# Patient Record
Sex: Female | Born: 1955 | Marital: Married | Smoking: Never smoker
Health system: Southern US, Community
[De-identification: ages and names within clinical notes are randomized; demographics above are authoritative.]

## PROBLEM LIST (undated history)

## (undated) DIAGNOSIS — F419 Anxiety disorder, unspecified: Secondary | ICD-10-CM

## (undated) DIAGNOSIS — E039 Hypothyroidism, unspecified: Secondary | ICD-10-CM

---

## 2013-10-27 DIAGNOSIS — M159 Polyosteoarthritis, unspecified: Secondary | ICD-10-CM | POA: Insufficient documentation

## 2013-10-27 DIAGNOSIS — E049 Nontoxic goiter, unspecified: Secondary | ICD-10-CM | POA: Insufficient documentation

## 2013-10-27 DIAGNOSIS — L719 Rosacea, unspecified: Secondary | ICD-10-CM | POA: Insufficient documentation

## 2013-10-27 DIAGNOSIS — E785 Hyperlipidemia, unspecified: Secondary | ICD-10-CM | POA: Insufficient documentation

## 2013-10-27 DIAGNOSIS — F3289 Other specified depressive episodes: Secondary | ICD-10-CM | POA: Insufficient documentation

## 2013-10-27 DIAGNOSIS — F329 Major depressive disorder, single episode, unspecified: Secondary | ICD-10-CM | POA: Insufficient documentation

## 2014-04-14 ENCOUNTER — Ambulatory Visit: Payer: Self-pay | Admitting: Internal Medicine

## 2014-04-15 DIAGNOSIS — F419 Anxiety disorder, unspecified: Secondary | ICD-10-CM | POA: Insufficient documentation

## 2014-04-15 DIAGNOSIS — E039 Hypothyroidism, unspecified: Secondary | ICD-10-CM | POA: Insufficient documentation

## 2015-03-06 ENCOUNTER — Other Ambulatory Visit: Payer: Self-pay | Admitting: Family Medicine

## 2015-03-06 DIAGNOSIS — Z1231 Encounter for screening mammogram for malignant neoplasm of breast: Secondary | ICD-10-CM

## 2015-03-13 ENCOUNTER — Ambulatory Visit: Payer: Self-pay

## 2015-03-23 ENCOUNTER — Ambulatory Visit
Admission: RE | Admit: 2015-03-23 | Discharge: 2015-03-23 | Disposition: A | Discharge status: Home / Self Care | Payer: BLUE CROSS/BLUE SHIELD | Source: Ambulatory Visit | Attending: Family Medicine | Admitting: Family Medicine

## 2015-03-23 DIAGNOSIS — Z1231 Encounter for screening mammogram for malignant neoplasm of breast: Secondary | ICD-10-CM | POA: Diagnosis not present

## 2015-06-09 ENCOUNTER — Emergency Department: Payer: BLUE CROSS/BLUE SHIELD

## 2015-06-09 ENCOUNTER — Encounter: Payer: Self-pay | Admitting: Emergency Medicine

## 2015-06-09 ENCOUNTER — Emergency Department
Admission: EM | Admit: 2015-06-09 | Discharge: 2015-06-09 | Disposition: A | Discharge status: Home / Self Care | Payer: BLUE CROSS/BLUE SHIELD | Attending: Emergency Medicine | Admitting: Emergency Medicine

## 2015-06-09 DIAGNOSIS — S43084A Other dislocation of right shoulder joint, initial encounter: Secondary | ICD-10-CM | POA: Diagnosis not present

## 2015-06-09 DIAGNOSIS — Y998 Other external cause status: Secondary | ICD-10-CM | POA: Diagnosis not present

## 2015-06-09 DIAGNOSIS — Y9389 Activity, other specified: Secondary | ICD-10-CM | POA: Diagnosis not present

## 2015-06-09 DIAGNOSIS — S9031XA Contusion of right foot, initial encounter: Secondary | ICD-10-CM | POA: Insufficient documentation

## 2015-06-09 DIAGNOSIS — S43004A Unspecified dislocation of right shoulder joint, initial encounter: Secondary | ICD-10-CM

## 2015-06-09 DIAGNOSIS — Y9289 Other specified places as the place of occurrence of the external cause: Secondary | ICD-10-CM | POA: Insufficient documentation

## 2015-06-09 DIAGNOSIS — W108XXA Fall (on) (from) other stairs and steps, initial encounter: Secondary | ICD-10-CM | POA: Diagnosis not present

## 2015-06-09 DIAGNOSIS — S4991XA Unspecified injury of right shoulder and upper arm, initial encounter: Secondary | ICD-10-CM | POA: Diagnosis present

## 2015-06-09 HISTORY — DX: Anxiety disorder, unspecified: F41.9

## 2015-06-09 MED ORDER — KETAMINE HCL 10 MG/ML IJ SOLN
INTRAMUSCULAR | Status: AC | PRN
  Administered 2015-06-09: 70 mg via INTRAVENOUS

## 2015-06-09 MED ORDER — MIDAZOLAM HCL 5 MG/5ML IJ SOLN
INTRAMUSCULAR | Status: AC
  Filled 2015-06-09: qty 5

## 2015-06-09 MED ORDER — ETOMIDATE 2 MG/ML IV SOLN
INTRAVENOUS | Status: AC | PRN
  Administered 2015-06-09: 8 mg via INTRAVENOUS

## 2015-06-09 MED ORDER — MIDAZOLAM HCL 5 MG/5ML IJ SOLN
2.0000 mg | Freq: Once | INTRAMUSCULAR | Status: AC
  Filled 2015-06-09: qty 5

## 2015-06-09 MED ORDER — MIDAZOLAM HCL 2 MG/2ML IJ SOLN
INTRAMUSCULAR | Status: AC | PRN
  Administered 2015-06-09: 2 mg via INTRAVENOUS

## 2015-06-09 MED ORDER — KETOROLAC TROMETHAMINE 30 MG/ML IJ SOLN
30.0000 mg | Freq: Once | INTRAMUSCULAR | Status: AC
Start: 2015-06-09 — End: 2015-06-09
  Administered 2015-06-09: 30 mg via INTRAVENOUS
  Filled 2015-06-09: qty 1

## 2015-06-09 MED ORDER — HYDROCODONE-ACETAMINOPHEN 5-325 MG PO TABS: 1.0000 | ORAL_TABLET | Freq: Once | ORAL | Status: DC

## 2015-06-09 MED ORDER — ETOMIDATE 2 MG/ML IV SOLN
8.0000 mg | Freq: Once | INTRAVENOUS | Status: AC
  Filled 2015-06-09: qty 10

## 2015-06-09 MED ORDER — ETOMIDATE 2 MG/ML IV SOLN: 10.0000 mg | Freq: Once | INTRAVENOUS | Status: DC

## 2015-06-09 MED ORDER — KETAMINE HCL 50 MG/ML IJ SOLN
INTRAMUSCULAR | Status: AC
  Filled 2015-06-09: qty 10

## 2015-06-09 NOTE — Discharge Instructions (Signed)
How to Use a Shoulder Immobilizer A shoulder immobilizer is a device that you may have to wear after a shoulder injury or surgery. This device keeps your arm from moving. This prevents additional pain or injury. It also supports your arm next to your body as your shoulder heals. You may need to wear a shoulder immobilizer to treat a broken bone (fracture) in your shoulder. You may also need to wear one if you have an injury that moves your shoulder out of position (dislocation). There are different types of shoulder immobilizers. The one that you get depends on your injury. RISKS AND COMPLICATIONS Wearing a shoulder immobilizer in the wrong way can let your injured shoulder move around too much. This may delay healing and make your pain and swelling worse. HOW TO USE YOUR SHOULDER IMMOBILIZER  The part of the immobilizer that goes around your neck (sling) should support your upper arm, with your elbow bent and your lower arm and hand across your chest.  Make sure that your elbow:  Is snug against the back pocket of the sling.  Does not move away from your body.  The strap of the immobilizer should go over your shoulder and support your arm and hand. Your hand should be slightly higher than your elbow. It should not hang loosely over the edge of the sling.  If the long strap has a pad, place it where it is most comfortable on your neck.  Carefully follow your health care provider's instructions for wearing your shoulder immobilizer. Your health care provider may want you to:  Loosen your immobilizer to straighten your elbow and move your wrist and fingers. You may have to do this several times each day. Ask your health care provider when you should do this and how often.  Remove your immobilizer once every day to shower, but limit the movement in your injured arm. Before putting the immobilizer back on, use a towel to dry the area under your arm completely.  Remove your immobilizer to do  shoulder exercises at home as directed by your health care provider.  Wear your immobilizer while you sleep. You may sleep more comfortably if you have your upper body raised on pillows. SEEK MEDICAL CARE IF:  Your immobilizer is not supporting your arm properly.  Your immobilizer gets damaged.  You have worsening pain or swelling in your shoulder, arm, or hand.  Your shoulder, arm, or hand changes color or temperature.  You lose feeling in your shoulder, arm, or hand.   This information is not intended to replace advice given to you by your health care provider. Make sure you discuss any questions you have with your health care provider.   Document Released: 08/08/2004 Document Revised: 11/15/2014 Document Reviewed: 06/08/2014 Elsevier Interactive Patient Education 2016 [employer]  Shoulder Dislocation A shoulder dislocation happens when the upper arm bone (humerus) moves out of the shoulder joint. The shoulder joint is the part of the shoulder where the humerus, shoulder blade (scapula), and collarbone (clavicle) meet. CAUSES This condition is often caused by:  A fall.  A hit to the shoulder.  A forceful movement of the shoulder. RISK FACTORS This condition is more likely to develop in people who play sports. SYMPTOMS Symptoms of this condition include:  Deformity of the shoulder.  Intense pain.  Inability to move the shoulder.  Numbness, weakness, or tingling in your neck or down your arm.  Bruising or swelling around your shoulder. DIAGNOSIS This condition is diagnosed with  a physical exam. After the exam, tests may be done to check for related problems. Tests that may be done include:  X-ray. This may be done to check for broken bones.  MRI. This may be done to check for damage to the tissues around the shoulder.  Electromyogram. This may be done to check for nerve damage. TREATMENT This condition is treated with a procedure to place the humerus back in  the joint. This procedure is called a reduction. There are two types of reduction:  Closed reduction. In this procedure, the humerus is placed back in the joint without surgery. The health care provider uses his or her hands to guide the bone back into place.  Open reduction. In this procedure, the humerus is placed back in the joint with surgery. An open reduction may be recommended if:  You have a weak shoulder joint or weak ligaments.  You have had more than one shoulder dislocation.  The nerves or blood vessels around your shoulder have been damaged. After the humerus is placed back into the joint, your arm will be placed in a splint or sling to prevent it from moving. You will need to wear the splint or sling until your shoulder heals. When the splint or sling is removed, you may have physical therapy to help improve the range of motion in your shoulder joint. HOME CARE INSTRUCTIONS If You Have a Splint or Sling:  Wear it as told by your health care provider. Remove it only as told by your health care provider.  Loosen it if your fingers become numb and tingle, or if they turn cold and blue.  Keep it clean and dry. Bathing  Do not take baths, swim, or use a hot tub until your health care provider approves. Ask your health care provider if you can take showers. You may only be allowed to take sponge baths for bathing.  If your health care provider approves bathing and showering, cover your splint or sling with a watertight plastic bag to protect it from water. Do not let the splint or sling get wet. Managing Pain, Stiffness, and Swelling  If directed, apply ice to the injured area.  Put ice in a plastic bag.  Place a towel between your skin and the bag.  Leave the ice on for 20 minutes, 2-3 times per day.  Move your fingers often to avoid stiffness and to decrease swelling.  Raise (elevate) the injured area above the level of your heart while you are sitting or lying  down. Driving  Do not drive while wearing a splint or sling on a hand that you use for driving.  Do not drive or operate heavy machinery while taking pain medicine. Activity  Return to your normal activities as told by your health care provider. Ask your health care provider what activities are safe for you.  Perform range-of-motion exercises only as told by your health care provider.  Exercise your hand by squeezing a soft ball. This helps to decrease stiffness and swelling in your hand and wrist. General Instructions  Take over-the-counter and prescription medicines only as told by your health care provider.  Do not use any tobacco products, including cigarettes, chewing tobacco, or e-cigarettes. Tobacco can delay bone and tissue healing. If you need help quitting, ask your health care provider.  Keep all follow-up visits as told by your health care provider. This is important. SEEK MEDICAL CARE IF:  Your splint or sling gets damaged. [place]  IF:  Your pain gets worse rather than better.  You lose feeling in your arm or hand.  Your arm or hand becomes white and cold.   This information is not intended to replace advice given to you by your health care provider. Make sure you discuss any questions you have with your health care provider.   Document Released: 03/26/2001 Document Revised: 03/22/2015 Document Reviewed: 10/24/2014 Elsevier Interactive Patient Education [employer].

## 2015-06-09 NOTE — ED Notes (Signed)
Pt presents to ED via POV from personal home with c/o of right shoulder injury due to an acute fall episode this evening. Pt states she "tripped" over a step stool by accident and fell the hardwood floor on affected site. Pt denies LOC and blurry vision. Swelling noted to affected site. Pt alert and oriented x4, ambulatory to Pod D area from triage.

## 2015-06-09 NOTE — ED Notes (Signed)
Pt totally AAOx3 at this time.

## 2015-06-09 NOTE — Sedation Documentation (Signed)
Shoulder reduced at this time, orders for follow up xray being placed.

## 2015-06-09 NOTE — ED Provider Notes (Signed)
Time Seen: Approximately ----------------------------------------- 9:53 PM on 06/09/2015 -----------------------------------------   I have reviewed the triage notes  Chief Complaint: Shoulder Injury   History of Present Illness: Alexandria Thompson is a 59 y.o. female who states she had a non-syncopal fall. Patient apparently tripped over his steps tstool and landed primarily on her right arm and shoulder region. She denies any head trauma. She denies any neck thoracic or lumbar spine pain. She states some mild pain in her right foot.   Past Medical History  Diagnosis Date  . Anxiety     There are no active problems to display for this patient.   History reviewed. No pertinent past surgical history.  History reviewed. No pertinent past surgical history.  No current outpatient prescriptions on file.  Allergies:  Review of patient's allergies indicates no known allergies.  Family History: Family History  Problem Relation Age of Onset  . Breast cancer Sister     Social History: Social History  Substance Use Topics  . Smoking status: Never Smoker   . Smokeless tobacco: None  . Alcohol Use: No     Review of Systems:    Constitutional: No fever Eyes: No visual disturbances Cardiac: No chest pain Respiratory: No shortness of breath, wheezing, or stridor Abdomen: No abdominal pain, no vomiting, No diarrhea Extremities: She denies any other extremity pain outside the right shoulder Skin: No rashes, easy bruising Neurologic: No focal weakness, trouble with speech or swollowing Patient describes some pain in or outside surface of her right foot. She denies any knee, hip pain.  Physical Exam:  ED Triage Vitals  Enc Vitals Group     BP 06/09/15 2101 112/61 mmHg     Pulse Rate 06/09/15 2101 78     Resp 06/09/15 2101 18     Temp 06/09/15 2101 98.2 F (36.8 C)     Temp Source 06/09/15 2101 Oral     SpO2 06/09/15 2101 98 %     Weight 06/09/15 2101 135 lb (61.236  kg)     Height 06/09/15 2101 5\' 2"  (1.575 m)     Head Cir --      Peak Flow --      Pain Score 06/09/15 2102 9     Pain Loc --      Pain Edu? --      Excl. in [place]? --     General: Awake , Alert , and Oriented times 3; GCS 15 Head: Normal cephalic , atraumatic Eyes: Pupils equal , round, reactive to light Nose/Throat: No nasal drainage, patent upper airway without erythema or exudate.  Neck: Supple, Full range of motion, No anterior adenopathy or palpable thyroid masses Lungs: Clear to ascultation without wheezes , rhonchi, or rales Heart: Regular rate, regular rhythm without murmurs , gallops , or rubs Abdomen: Soft, non tender without rebound, guarding , or rigidity; bowel sounds positive and symmetric in all 4 quadrants. No organomegaly .        Extremities: 2 plus symmetric pulses. No edema, clubbing or cyanosis Neurologic: normal ambulation, Motor symmetric without deficits, sensory intact Skin: warm, dry, no rashes     Radiology:   Narrative:    CLINICAL DATA: Lateral foot pain after tripping over stool today.  EXAM: RIGHT FOOT - 2 VIEW  COMPARISON: None.  FINDINGS: Alignment at the Lisfranc joint is normal on these two views. I do not see a fracture of the fifth metatarsal or compelling evidence of a anterior process fracture of the calcaneus.  Plantar and Achilles calcaneal spurs are present. There is some minimal dorsal soft tissue swelling in the vicinity of the proximal metaphyses of the metatarsals.  IMPRESSION: 1. No fracture observed on this two view series. No obvious malalignment. 2. Plantar and Achilles calcaneal spurs. 3. Minimal dorsal soft tissue swelling along the proximal forefoot.   Electronically Signed By: Van Clines M.D. On: 06/09/2015 22:41          DG Shoulder Right (Final result) Result time: 06/09/15 22:39:30   Final result by Rad Results In Interface (06/09/15 22:39:30)   Narrative:   CLINICAL DATA:  Glenohumeral joint anterior inferior dislocation, status postreduction.  EXAM: RIGHT SHOULDER - 2+ VIEW  COMPARISON: 06/09/2015 at 9:08 p.m.  FINDINGS: Internally rotated and transscapular views of the right shoulder suggests a small Hill-Sachs impaction along the humeral head. No definite bony Bankart. The shoulder has been successfully reduced.  IMPRESSION: 1. Successful reduction. Small Hill-Sachs impaction noted along the humeral head.   Electronically Signed By: Van Clines M.D. On: 06/09/2015 22:39          DG HumerUS Right (Final result) Result time: 06/09/15 21:18:55   Final result by Rad Results In Interface (06/09/15 21:18:55)   Narrative:   CLINICAL DATA: Tripped and fell, landed on shoulder with pain  EXAM: RIGHT HUMERUS - 2+ VIEW  COMPARISON: None  FINDINGS: Femoral head is located in an abnormal inferior anterior and medial position. No fracture identified involving the humerus.  IMPRESSION: Anterior dislocation of the right humerus.   Electronically Signed By: Skipper Cliche M.D. On: 06/09/2015 21:18          DG Shoulder Right (Final result) Result time: 06/09/15 21:19:38   Final result by Rad Results In Interface (06/09/15 21:19:38)   Narrative:   CLINICAL DATA: Fell over stool landing on shoulder. Shoulder pain.  EXAM: RIGHT SHOULDER - 2+ VIEW  COMPARISON: None.  FINDINGS: Anterior inferior dislocation of the right humeral head with respect to the glenoid observed. No well-defined Hill-Sachs impaction or well-defined bony Bankart injury is identified. No other fracture in the vicinity observed. AC joint alignment normal.  IMPRESSION: 1. Anterior inferior right glenohumeral dislocation.   Electronically Signed By: Van Clines M.D. On: 06/09/2015 21:19              I personally reviewed the radiologic studies   Procedures: #1: Conscious sedation: After consent was obtained  and alternatives discussed and risk factors reviewed the patient agreed to conscious sedation for reduction of her right shoulder dislocation. Patient's airway was assessed and we did not see any complications with giving anesthetic-type medication. Patient received first he tolerated that 8 mg IV, Versed 3 mg IV. The patient did not appear to have adequate relaxation to attempt reduction. He then received ketamine 70 mg IV. She appeared to have adequate relaxation and tolerated her anesthetic well. Bedside one-on-one care with conscious sedation with drug spurs by myself was total of 31 minutes. #2: Right shoulder reduction. Patient has an anterior dislocation and was reduced with retraction and lateral rotation of her right upper extremity. Patient had what felt like adequate reduction and follow-up x-rays show Hill-Sachs deformity but no other abnormalities. He remained neurovascularly intact.    ED Course:  Patient tolerated his stay here well and had adequate reduction of her shoulder. X-rays of her right foot show no fracture or dislocation. Patient felt comfortable with outpatient management and was observed here until her conscious sedation medication wore off that point that she was ambulatory.  All questions concerns were addressed at the bedside with her husband present.    Assessment:  Traumatic right shoulder dislocation Conscious sedation Right foot contusion   Plan:  Outpatient management Patient was advised to return immediately if condition worsens. Patient was advised to follow up with her primary care physician or other specialized physicians involved and in their current assessment.             Daymon Larsen, MD 06/09/15 2258

## 2015-11-29 ENCOUNTER — Ambulatory Visit (INDEPENDENT_AMBULATORY_CARE_PROVIDER_SITE_OTHER): Payer: BLUE CROSS/BLUE SHIELD

## 2015-11-29 ENCOUNTER — Ambulatory Visit (INDEPENDENT_AMBULATORY_CARE_PROVIDER_SITE_OTHER): Payer: BLUE CROSS/BLUE SHIELD | Admitting: Podiatry

## 2015-11-29 ENCOUNTER — Encounter: Payer: Self-pay | Admitting: Podiatry

## 2015-11-29 VITALS — BP 118/66 | HR 76 | Resp 16

## 2015-11-29 DIAGNOSIS — M779 Enthesopathy, unspecified: Secondary | ICD-10-CM

## 2015-11-29 DIAGNOSIS — M722 Plantar fascial fibromatosis: Secondary | ICD-10-CM

## 2015-11-29 MED ORDER — METHYLPREDNISOLONE 4 MG PO TBPK: ORAL_TABLET | ORAL | Status: DC

## 2015-11-29 MED ORDER — MELOXICAM 15 MG PO TABS: 15.0000 mg | ORAL_TABLET | Freq: Every day | ORAL | Status: DC

## 2015-11-29 NOTE — Progress Notes (Signed)
   Subjective:    Patient ID: Alexandria Thompson, female    DOB: 08/27/1955, 60 y.o.   MRN: [id number]  HPI: She presents today with a month duration of pain to the dorsal medial aspect of the left foot with burning and tingling. She denies any trauma. She points to the dorsal medial aspect of the first metatarsal medial cuneiform joint. She states that just the other day the foot was red and swollen in this area that she can't put any weight on it.    Review of Systems  All other systems reviewed and are negative.      Objective:   Physical Exam: Vital signs are stable alert and oriented 3. Pulses are palpable. Neurologic sensorium is intact. Deep tendon reflexes are intact. Muscle strength is 5 over 5 dorsiflexion plantar flexors and inverters everters all intrinsic musculature is intact. Orthopedic evaluation demonstrates all joints distal to the ankle range of motion without crepitation. She does have tenderness on palpation of the tibialis anterior at its insertion site at the base of the first metatarsal medial cuneiform joint region. It is soft tissue edema in the area radiographs do confirm soft tissue edema in this area no major osteoarthritis developing.        Assessment & Plan:  Insertional tibialis anterior tendinitis left.  Plan: At this point I put a small amount of dexamethasone at the point of maximal tenderness 2 mg was the dose. Started on a Medrol Dosepak to be followed by meloxicam. We discussed icing the area as well we also placed her in a short cam walker and I will follow-up with her in 1 month if she is not significantly improved and MRI will be necessary at that point.

## 2016-01-01 ENCOUNTER — Ambulatory Visit (INDEPENDENT_AMBULATORY_CARE_PROVIDER_SITE_OTHER): Payer: BLUE CROSS/BLUE SHIELD | Admitting: Podiatry

## 2016-01-01 ENCOUNTER — Encounter: Payer: Self-pay | Admitting: Podiatry

## 2016-01-01 VITALS — BP 99/60 | HR 72 | Resp 12

## 2016-01-01 DIAGNOSIS — M779 Enthesopathy, unspecified: Secondary | ICD-10-CM

## 2016-01-01 NOTE — Progress Notes (Signed)
She presents today after 1 month for follow-up of her posterior and anterior tibialis tendinitis left foot. She states that she has been wearing the boot at all times and the foot feels much better and the swelling has gone down.  Objective: Vital signs are stable alert and oriented 3. Pulses are palpable. Neurologic sensorium is intact. Deep tendon reflexes are intact. No tenderness on palpation of the anterior tibialis tendon or the posterior tibialis tendon along the length of the tendon or the insertion sites. There is no swelling to the foot.  Assessment: Well-healing tendinitis.  Plan: I placed her in a Tri-Lock brace today and will follow-up with her in 1 month if necessary.

## 2016-03-07 DIAGNOSIS — E7800 Pure hypercholesterolemia, unspecified: Secondary | ICD-10-CM | POA: Insufficient documentation

## 2016-06-11 ENCOUNTER — Other Ambulatory Visit: Payer: Self-pay | Admitting: Obstetrics and Gynecology

## 2016-06-11 DIAGNOSIS — Z1231 Encounter for screening mammogram for malignant neoplasm of breast: Secondary | ICD-10-CM

## 2016-07-18 ENCOUNTER — Ambulatory Visit
Admission: RE | Admit: 2016-07-18 | Discharge: 2016-07-18 | Disposition: A | Discharge status: Home / Self Care | Payer: BLUE CROSS/BLUE SHIELD | Source: Ambulatory Visit | Attending: Obstetrics and Gynecology | Admitting: Obstetrics and Gynecology

## 2016-07-18 DIAGNOSIS — Z1231 Encounter for screening mammogram for malignant neoplasm of breast: Secondary | ICD-10-CM | POA: Insufficient documentation

## 2016-12-30 ENCOUNTER — Ambulatory Visit: Payer: BLUE CROSS/BLUE SHIELD

## 2016-12-31 ENCOUNTER — Ambulatory Visit: Payer: BLUE CROSS/BLUE SHIELD

## 2017-01-06 ENCOUNTER — Ambulatory Visit: Payer: BLUE CROSS/BLUE SHIELD

## 2017-02-24 ENCOUNTER — Ambulatory Visit: Payer: BLUE CROSS/BLUE SHIELD

## 2017-05-21 DIAGNOSIS — H6122 Impacted cerumen, left ear: Secondary | ICD-10-CM | POA: Insufficient documentation

## 2017-05-21 DIAGNOSIS — H9193 Unspecified hearing loss, bilateral: Secondary | ICD-10-CM | POA: Insufficient documentation

## 2017-06-18 DIAGNOSIS — H903 Sensorineural hearing loss, bilateral: Secondary | ICD-10-CM | POA: Insufficient documentation

## 2017-08-29 ENCOUNTER — Other Ambulatory Visit: Payer: Self-pay | Admitting: Family Medicine

## 2017-08-29 DIAGNOSIS — Z1231 Encounter for screening mammogram for malignant neoplasm of breast: Secondary | ICD-10-CM

## 2017-09-16 ENCOUNTER — Ambulatory Visit
Admission: RE | Admit: 2017-09-16 | Discharge: 2017-09-16 | Disposition: A | Discharge status: Home / Self Care | Payer: Managed Care, Other (non HMO) | Source: Ambulatory Visit | Attending: Family Medicine | Admitting: Family Medicine

## 2017-09-16 DIAGNOSIS — R928 Other abnormal and inconclusive findings on diagnostic imaging of breast: Secondary | ICD-10-CM | POA: Insufficient documentation

## 2017-09-16 DIAGNOSIS — Z1231 Encounter for screening mammogram for malignant neoplasm of breast: Secondary | ICD-10-CM | POA: Diagnosis not present

## 2017-09-18 ENCOUNTER — Other Ambulatory Visit: Payer: Self-pay | Admitting: Family Medicine

## 2017-09-18 DIAGNOSIS — R928 Other abnormal and inconclusive findings on diagnostic imaging of breast: Secondary | ICD-10-CM

## 2017-09-18 DIAGNOSIS — N6489 Other specified disorders of breast: Secondary | ICD-10-CM

## 2017-09-29 ENCOUNTER — Other Ambulatory Visit: Payer: Self-pay | Admitting: Family Medicine

## 2017-09-29 ENCOUNTER — Ambulatory Visit
Admission: RE | Admit: 2017-09-29 | Discharge: 2017-09-29 | Disposition: A | Discharge status: Home / Self Care | Payer: Managed Care, Other (non HMO) | Source: Ambulatory Visit | Attending: Family Medicine | Admitting: Family Medicine

## 2017-09-29 DIAGNOSIS — R928 Other abnormal and inconclusive findings on diagnostic imaging of breast: Secondary | ICD-10-CM

## 2017-09-29 DIAGNOSIS — N6002 Solitary cyst of left breast: Secondary | ICD-10-CM | POA: Insufficient documentation

## 2017-09-29 DIAGNOSIS — N6489 Other specified disorders of breast: Secondary | ICD-10-CM

## 2018-04-18 ENCOUNTER — Emergency Department
Admission: EM | Admit: 2018-04-18 | Discharge: 2018-04-18 | Disposition: A | Discharge status: Home / Self Care | Payer: Managed Care, Other (non HMO) | Attending: Emergency Medicine | Admitting: Emergency Medicine

## 2018-04-18 ENCOUNTER — Emergency Department: Payer: Managed Care, Other (non HMO)

## 2018-04-18 ENCOUNTER — Other Ambulatory Visit: Payer: Self-pay

## 2018-04-18 ENCOUNTER — Encounter: Payer: Self-pay | Admitting: Emergency Medicine

## 2018-04-18 DIAGNOSIS — E039 Hypothyroidism, unspecified: Secondary | ICD-10-CM | POA: Diagnosis not present

## 2018-04-18 DIAGNOSIS — Y9301 Activity, walking, marching and hiking: Secondary | ICD-10-CM | POA: Insufficient documentation

## 2018-04-18 DIAGNOSIS — R202 Paresthesia of skin: Secondary | ICD-10-CM | POA: Insufficient documentation

## 2018-04-18 DIAGNOSIS — W010XXA Fall on same level from slipping, tripping and stumbling without subsequent striking against object, initial encounter: Secondary | ICD-10-CM | POA: Diagnosis not present

## 2018-04-18 DIAGNOSIS — Y998 Other external cause status: Secondary | ICD-10-CM | POA: Insufficient documentation

## 2018-04-18 DIAGNOSIS — R200 Anesthesia of skin: Secondary | ICD-10-CM | POA: Insufficient documentation

## 2018-04-18 DIAGNOSIS — W19XXXA Unspecified fall, initial encounter: Secondary | ICD-10-CM

## 2018-04-18 DIAGNOSIS — Z79899 Other long term (current) drug therapy: Secondary | ICD-10-CM | POA: Insufficient documentation

## 2018-04-18 DIAGNOSIS — S40011A Contusion of right shoulder, initial encounter: Secondary | ICD-10-CM

## 2018-04-18 DIAGNOSIS — S20211A Contusion of right front wall of thorax, initial encounter: Secondary | ICD-10-CM

## 2018-04-18 DIAGNOSIS — Y92512 Supermarket, store or market as the place of occurrence of the external cause: Secondary | ICD-10-CM | POA: Diagnosis not present

## 2018-04-18 DIAGNOSIS — S4991XA Unspecified injury of right shoulder and upper arm, initial encounter: Secondary | ICD-10-CM | POA: Diagnosis present

## 2018-04-18 MED ORDER — MELOXICAM 15 MG PO TABS: 15.0000 mg | ORAL_TABLET | Freq: Every day | ORAL | 0 refills | Status: DC

## 2018-04-18 MED ORDER — HYDROCODONE-ACETAMINOPHEN 5-325 MG PO TABS: 1.0000 | ORAL_TABLET | ORAL | 0 refills | Status: DC | PRN

## 2018-04-18 NOTE — ED Provider Notes (Signed)
[facility] Emergency Department Provider Note  ____________________________________________  Time seen: Approximately 5:02 PM  I have reviewed the triage vital signs and the nursing notes.   HISTORY  Chief Complaint Arm Pain and Rib Injury    HPI Alexandria Thompson is a 62 y.o. female who presents the emergency department complaining of right rib, right shoulder, right upper arm pain.  Patient reports that she was at the grocery store, slipped on some wet floor, landing on her right side.  Patient reports that she struck a concrete piling on the way down.  She did not hit her head or lose consciousness.  Patient denies any shortness of breath or cough after the accident.  Main complaint is right rib pain, right shoulder/upper arm pain.  Patient does have some intermittent numbness and tingling down the right upper extremity.  She has limited range of motion to her shoulder at this time.  She is able to flex and extend as well as supinate and pronate the elbow appropriately.  Full range of motion to the wrist and all digits right hand.  Patient denies any headache, chest pain, shortness of breath, abdominal pain, nausea vomiting.  No medications for this complaint prior to arrival.    Past Medical History:  Diagnosis Date  . Anxiety     Patient Active Problem List   Diagnosis Date Noted  . Acquired hypothyroidism 04/15/2014  . Anxiety 04/15/2014    History reviewed. No pertinent surgical history.  Prior to Admission medications   Medication Sig Start Date End Date Taking? Authorizing Provider  HYDROcodone-acetaminophen (NORCO/VICODIN) 5-325 MG tablet Take 1 tablet by mouth every 4 (four) hours as needed for moderate pain. 04/18/18   Lesly Joslyn, Charline Bills, PA-C  levothyroxine (SYNTHROID, LEVOTHROID) 88 MCG tablet Take by mouth. 03/06/15 03/05/16  [provider]  meloxicam (MOBIC) 15 MG tablet Take 1 tablet (15 mg total) by mouth daily. 04/18/18    Carey Lafon, Charline Bills, PA-C  methylPREDNISolone (MEDROL) 4 MG TBPK tablet Tapering 6 day dose pack 11/29/15   Hyatt, Max T, DPM  Multiple Vitamin (MULTI-VITAMINS) TABS Take by mouth.    [provider]  sertraline (ZOLOFT) 100 MG tablet Take by mouth. 03/06/15   [provider]  simvastatin (ZOCOR) 10 MG tablet Take by mouth. 03/09/15 03/08/16  [provider]    Allergies Codeine  Family History  Problem Relation Age of Onset  . Breast cancer Sister   . Breast cancer Paternal Grandmother     Social History Social History   Tobacco Use  . Smoking status: Never Smoker  Substance Use Topics  . Alcohol use: No  . Drug use: No     Review of Systems  Constitutional: No fever/chills Eyes: No visual changes.  Cardiovascular: no chest pain. Respiratory: no cough. No SOB. Gastrointestinal: No abdominal pain.  No nausea, no vomiting.   Musculoskeletal: Positive for right rib, right shoulder, right upper extremity pain. Skin: Negative for rash, abrasions, lacerations, ecchymosis. Neurological: Negative for headaches, focal weakness or numbness. 10-point ROS otherwise negative.  ____________________________________________   PHYSICAL EXAM:  VITAL SIGNS: ED Triage Vitals  Enc Vitals Group     BP 04/18/18 1638 (!) 102/48     Pulse Rate 04/18/18 1638 66     Resp 04/18/18 1638 20     Temp 04/18/18 1638 97.6 F (36.4 C)     Temp Source 04/18/18 1638 Oral     SpO2 04/18/18 1638 97 %     Weight 04/18/18  1640 140 lb (63.5 kg)     Height 04/18/18 1640 5\' 3"  (1.6 m)     Head Circumference --      Peak Flow --      Pain Score 04/18/18 1639 8     Pain Loc --      Pain Edu? --      Excl. in [place]? --      Constitutional: Alert and oriented. Well appearing and in no acute distress. Eyes: Conjunctivae are normal. PERRL. EOMI. Head: Atraumatic.  No visible signs of trauma with ecchymosis, lacerations or abrasions.  Nontender to palpation of the osseous  structures of the face and skull.  No battle signs, raccoon eyes, serosanguineous fluid drainage from the ears or nares ENT:      Ears:       Nose: No congestion/rhinnorhea.      Mouth/Throat: Mucous membranes are moist.  Neck: No stridor.  Visualization of the cervical spine reveals no visible signs of trauma with ecchymosis, lacerations or abrasions.  No visible abnormality or deformity.  No midline cervical spine tenderness to palpation.  Mild tenderness to palpation right paraspinal muscle region.  Radial pulse intact bilateral upper extremities.  Incision intact and equal bilateral upper extremities.  Cardiovascular: Normal rate, regular rhythm. Normal S1 and S2.  Good peripheral circulation. Respiratory: Normal respiratory effort without tachypnea or retractions. Lungs CTAB. Good air entry to the bases with no decreased or absent breath sounds. Gastrointestinal: Visualization of the abdominal wall reveals no lacerations, abrasions, ecchymosis.  Bowel sounds 4 quadrants. Soft and nontender to palpation. No guarding or rigidity. No palpable masses. No distention. No CVA tenderness. Musculoskeletal: Limited range of motion to the right shoulder, otherwise full range of motion to extremities.. No gross deformities appreciated.  Visualization of the right shoulder reveals no visible signs of trauma.  Limited range of motion due to pain.  Patient is diffusely tender to palpation along the anterior and lateral aspect of the shoulder.  No specific point tenderness or palpable abnormality.  No tenderness palpation over the posterior shoulder/scapula.  Visualization of the right upper extremity reveals no deformity, gross edema, ecchymosis.  Patient is tender mid humerus with no palpable abnormality.  No tenderness to palpation.  Full range of motion to the right elbow, right wrist.  Full range of motion all 5 digits right hand.  Visualization of the ribs reveals no deformity, no paradoxical chest wall  movement.  Patient is diffusely tender to palpation in ribs 6 through 10 right rib cage.  No palpable abnormality.  No subcutaneous emphysema.  Good underlying breath sounds bilaterally. Neurologic:  Normal speech and language. No gross focal neurologic deficits are appreciated.  Cranial nerves II through XII grossly intact. Skin:  Skin is warm, dry and intact. No rash noted. Psychiatric: Mood and affect are normal. Speech and behavior are normal. Patient exhibits appropriate insight and judgement.   ____________________________________________   LABS (all labs ordered are listed, but only abnormal results are displayed)  Labs Reviewed - No data to display ____________________________________________  EKG   ____________________________________________  RADIOLOGY I personally viewed and evaluated these images as part of my medical decision making, as well as reviewing the written report by the radiologist.   Dg Ribs Unilateral W/chest Right  Result Date: 04/18/2018 CLINICAL DATA:  [place] onto RIGHT side today at a grocery store, diffuse RIGHT side chest wall pain, RIGHT upper arm pain in RIGHT shoulder pain EXAM: RIGHT RIBS AND CHEST - 3+ VIEW COMPARISON:  None FINDINGS: Normal heart size, mediastinal contours, and pulmonary vascularity. Lungs clear. No infiltrate, pleural effusion or pneumothorax. Osseous mineralization normal. Visualized RIGHT shoulder unremarkable. No definite rib fractures identified. IMPRESSION: No acute abnormalities. Electronically Signed   By: Lavonia Dana M.D.   On: 04/18/2018 17:38   Dg Cervical Spine 2-3 Views  Result Date: 04/18/2018 CLINICAL DATA:  [place] onto RIGHT side today at a grocery store, diffuse RIGHT side chest wall pain, RIGHT upper arm pain and RIGHT shoulder pain EXAM: CERVICAL SPINE - 2-3 VIEW COMPARISON:  None FINDINGS: Prevertebral soft tissues normal thickness. Bones demineralized. Reversal of cervical lordosis question muscle spasm. Disc space  narrowing with endplate spur formation at C4-C5, C5-C6 and C6-C7. Minimal anterolisthesis at C3-C4. Scattered mild facet degenerative changes. Vertebral body heights maintained without fracture or additional subluxation. No bone destruction. Odontoid slightly approximates the LEFT lateral mass of C1 though head is rotated. IMPRESSION: Degenerative disc and facet disease changes of the cervical spine as above with reversal of cervical lordosis question muscle spasm. No definite fractures identified. Electronically Signed   By: Lavonia Dana M.D.   On: 04/18/2018 17:40   Dg Shoulder Right  Result Date: 04/18/2018 CLINICAL DATA:  Right shoulder pain after fall today EXAM: RIGHT SHOULDER - 2+ VIEW COMPARISON:  None. FINDINGS: There is no evidence of fracture or dislocation. There is no evidence of arthropathy or other focal bone abnormality. Soft tissues are unremarkable. IMPRESSION: No right shoulder fracture or malalignment. Electronically Signed   By: Ilona Sorrel M.D.   On: 04/18/2018 17:35   Dg Humerus Right  Result Date: 04/18/2018 CLINICAL DATA:  [place] onto RIGHT side today at a grocery store, diffuse RIGHT side chest wall pain, RIGHT upper arm pain and RIGHT shoulder pain EXAM: RIGHT HUMERUS - 2+ VIEW COMPARISON:  RIGHT shoulder radiographs 06/09/2015 FINDINGS: AC joint alignment normal. Bones demineralized. Shoulder and elbow joint alignments grossly normal. No fracture, dislocation, or bone destruction. IMPRESSION: No acute osseous abnormalities. Electronically Signed   By: Lavonia Dana M.D.   On: 04/18/2018 17:41    ____________________________________________    PROCEDURES  Procedure(s) performed:    Procedures    Medications - No data to display   ____________________________________________   INITIAL IMPRESSION / ASSESSMENT AND PLAN / ED COURSE  Pertinent labs & imaging results that were available during my care of the patient were reviewed by me and considered in my medical  decision making (see chart for details).  Review of the [place] CSRS was performed in accordance of the [place] prior to dispensing any controlled drugs.      Patient's diagnosis is consistent with fall resulting in contusion of the shoulder, ribs.  Patient presented to the emergency department complaining of right-sided neck, right rib, right shoulder, right upper arm pain.  Exam was reassuring with lung sounds bilaterally, no significant visible signs of trauma.  X-ray reveals no acute osseous abnormality.  Chest x-ray reveals no pneumothorax.  Differential included fracture, dislocation of the shoulder, rotator cuff tear, pneumothorax, pulmonary contusion.  Patient will be placed on anti-inflammatory given limited prescription of pain medication to be taken as needed.  Follow-up with primary care as needed. Patient is given ED precautions to return to the ED for any worsening or new symptoms.     ____________________________________________  FINAL CLINICAL IMPRESSION(S) / ED DIAGNOSES  Final diagnoses:  Fall, initial encounter  Contusion of right shoulder, initial encounter  Contusion of rib on right side, initial encounter  NEW MEDICATIONS STARTED DURING THIS VISIT:  ED Discharge Orders         Ordered    meloxicam (MOBIC) 15 MG tablet  Daily     04/18/18 1846    HYDROcodone-acetaminophen (NORCO/VICODIN) 5-325 MG tablet  Every 4 hours PRN     04/18/18 1846              This chart was dictated using voice recognition software/Dragon. Despite best efforts to proofread, errors can occur which can change the meaning. Any change was purely unintentional.    Darletta Moll, PA-C 04/18/18 1847    Nena Polio, MD 04/19/18 [id number]

## 2018-04-18 NOTE — ED Triage Notes (Signed)
Slipped and fell just prior to arrival, pain R upper arm and ribcage.

## 2018-04-18 NOTE — ED Notes (Signed)
Fell  At grocery store   Pain  In r  Rib cage   Decreased rom    Pain r shoulder down   Did not hit head  Is awake and alert

## 2019-01-05 ENCOUNTER — Other Ambulatory Visit: Payer: Self-pay | Admitting: Internal Medicine

## 2019-01-05 DIAGNOSIS — Z20822 Contact with and (suspected) exposure to covid-19: Secondary | ICD-10-CM

## 2019-01-09 LAB — NOVEL CORONAVIRUS, NAA: SARS-CoV-2, NAA: NOT DETECTED

## 2019-03-17 IMAGING — MG MM DIGITAL SCREENING BILAT W/ TOMO W/ CAD
9 of 15 series · 9 of 31 positions shown · non-contrast
Comparison: Previous exam(s).

CLINICAL DATA: Screening.

EXAM:
DIGITAL SCREENING BILATERAL MAMMOGRAM WITH TOMO AND CAD

[L MLO (1 of 2)]
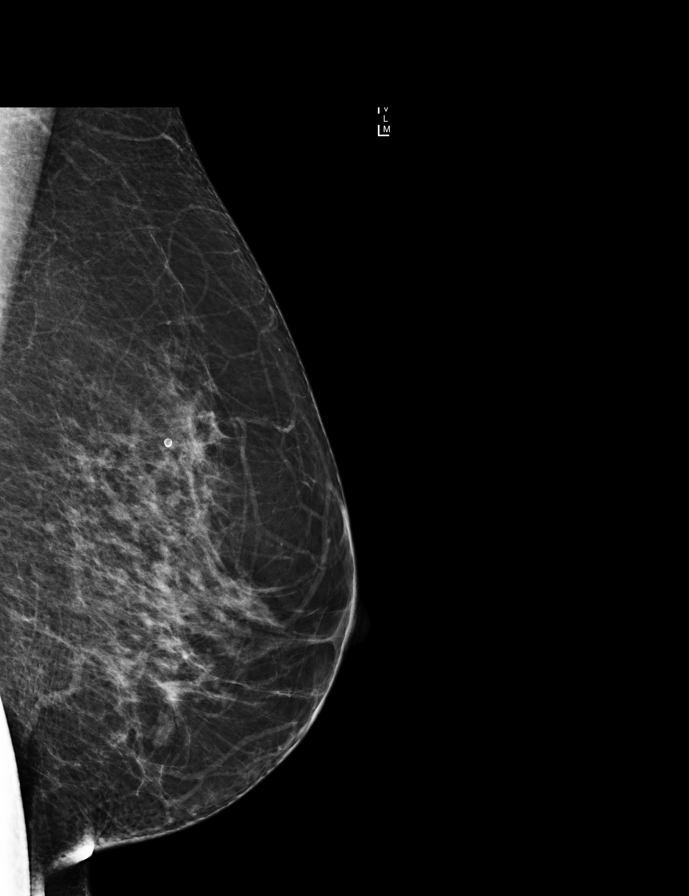

[R MLO (1 of 2)]
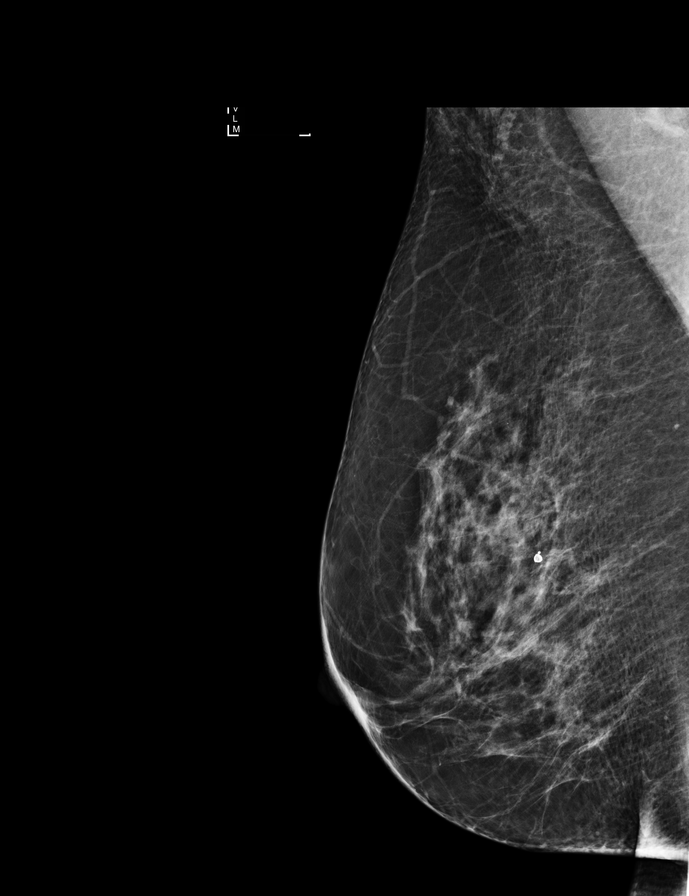

[R CC]
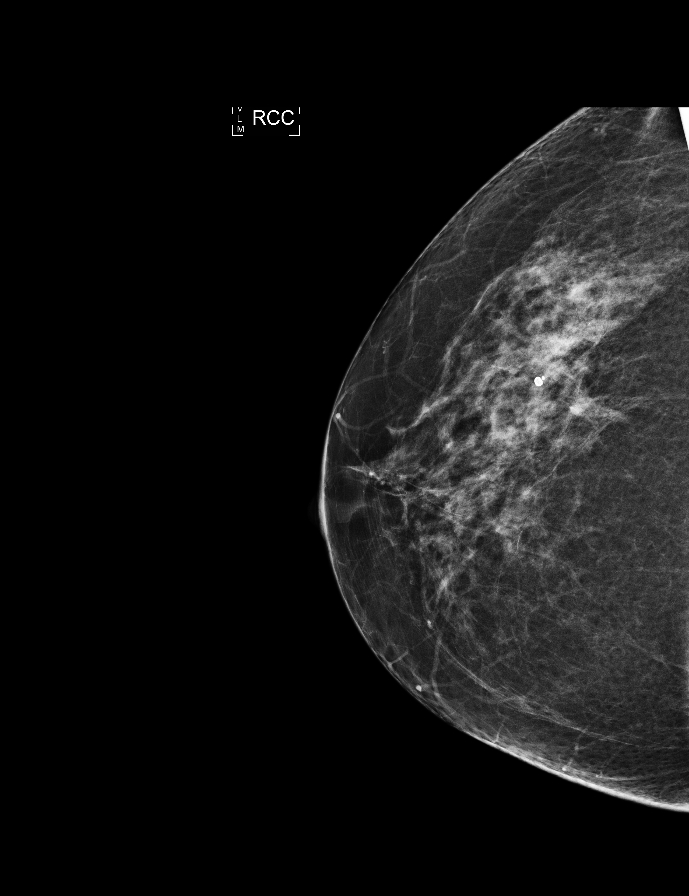

[R MLO (2 of 2)]
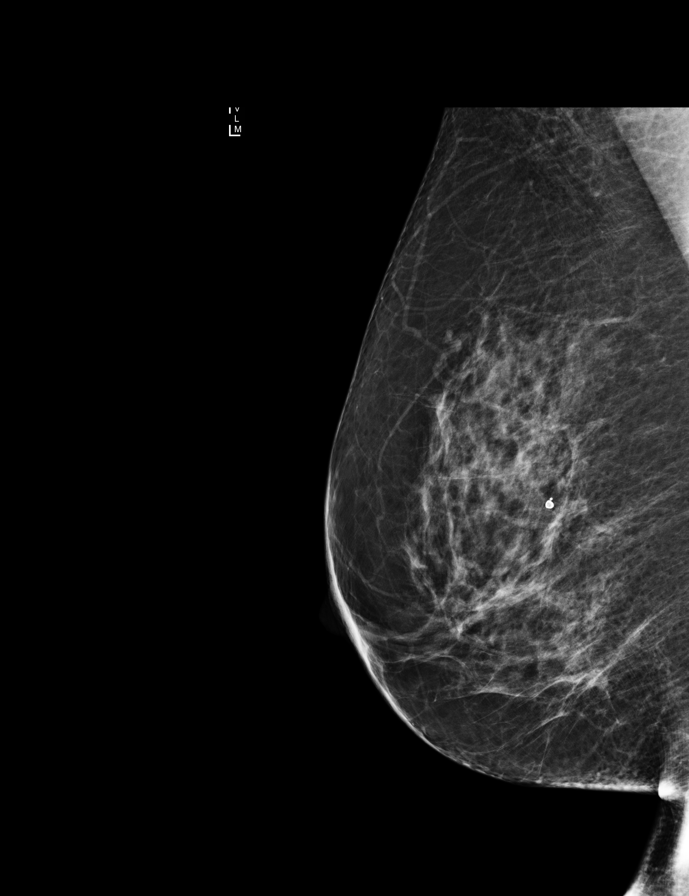

[L CC synth-2D]
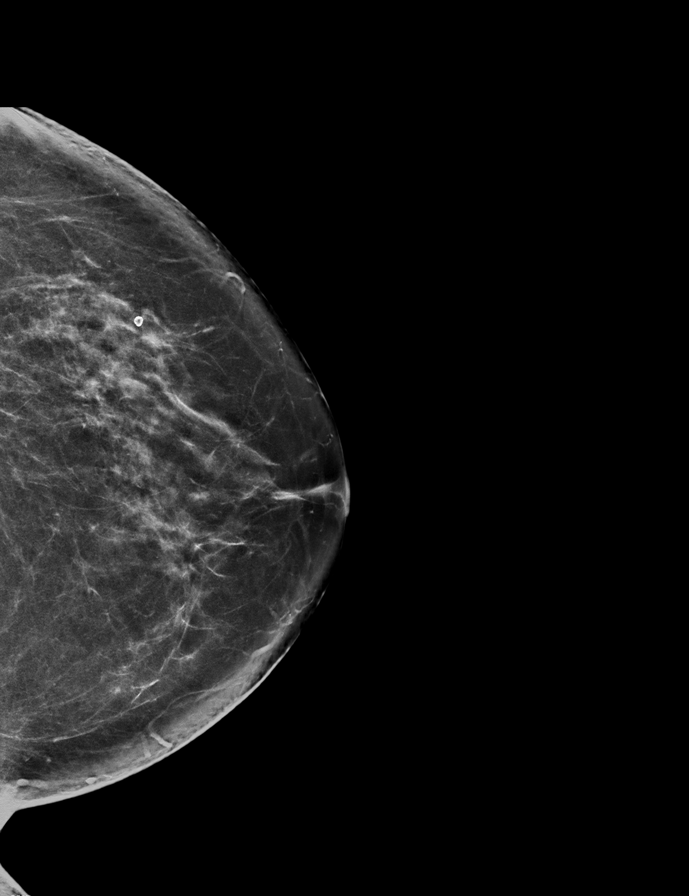

[L MLO (2 of 2)]
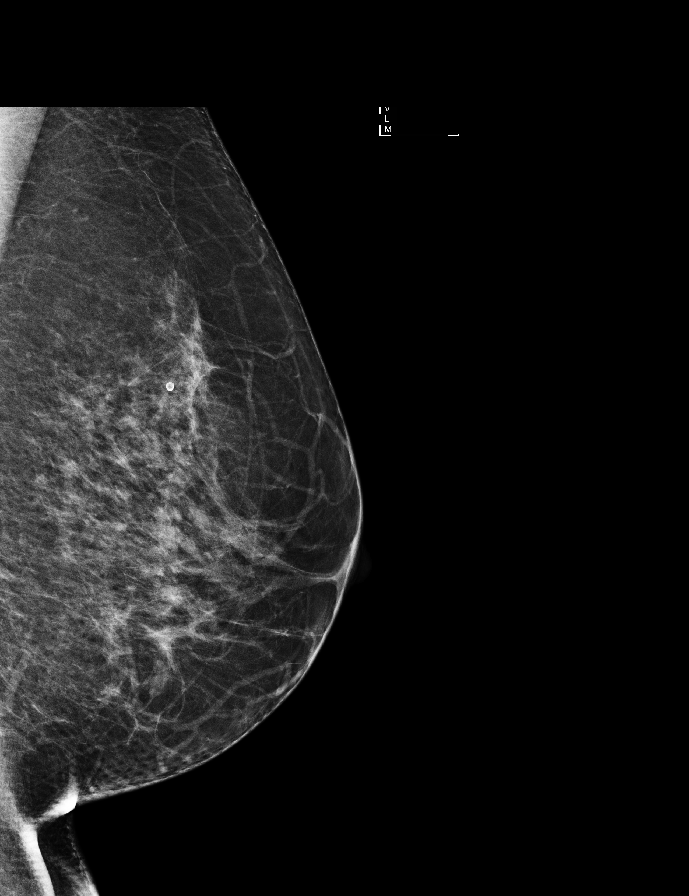

[R CC synth-2D]
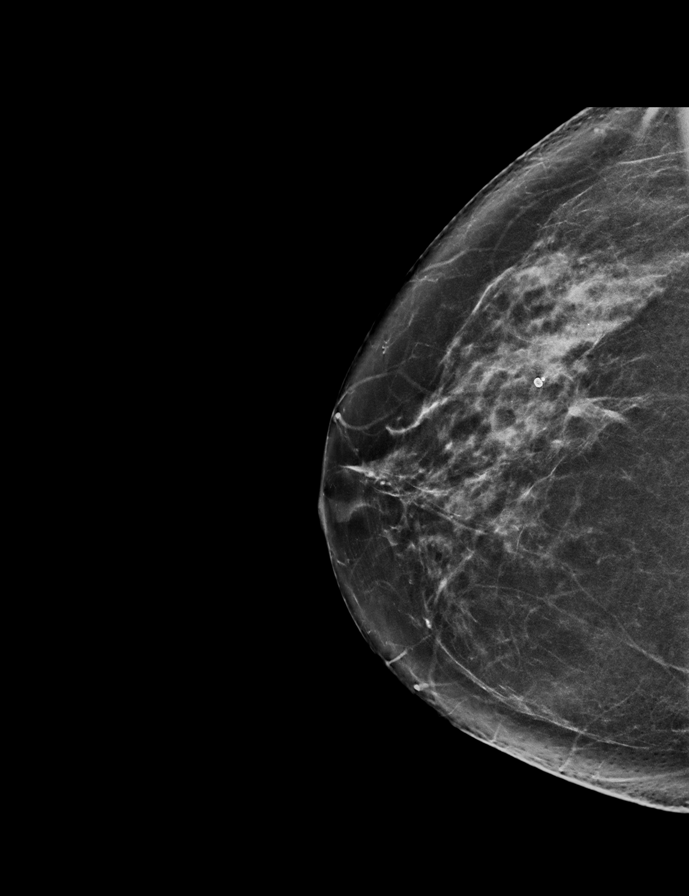

[L CC]
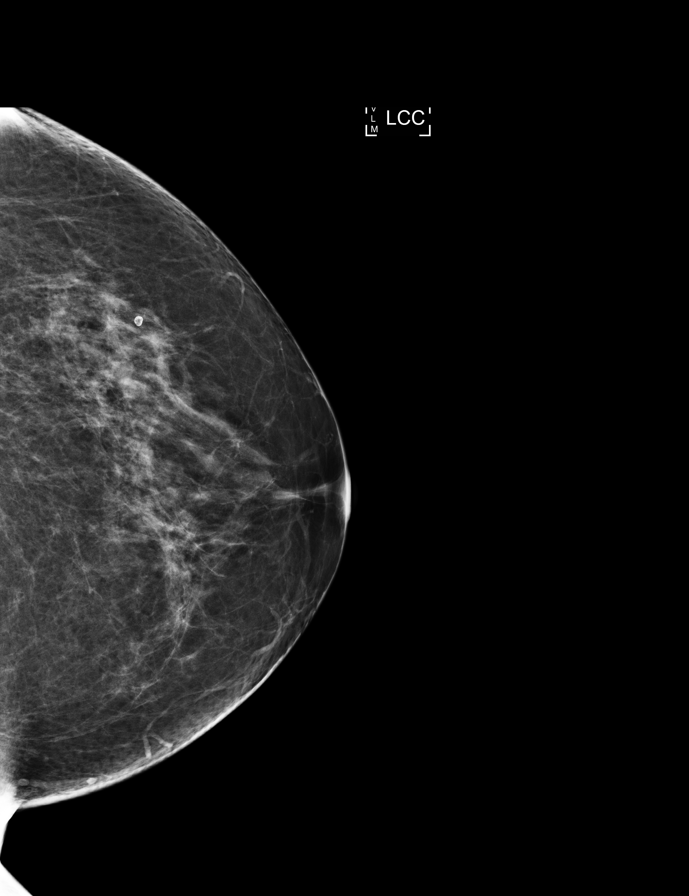

[L MLO synth-2D]
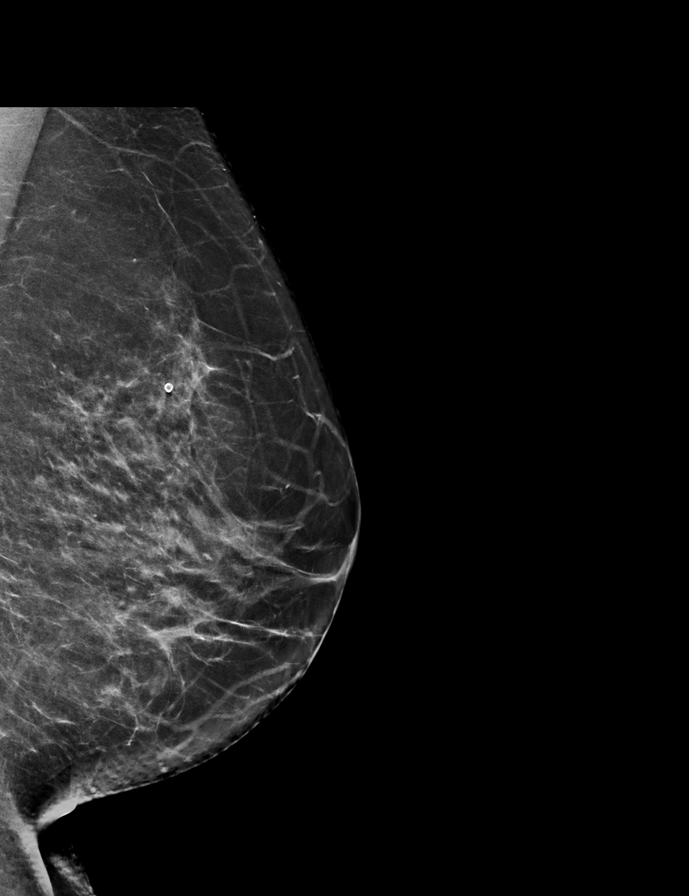

[9 of 31 positions shown; findings below may reference images not displayed]

ACR Breast Density Category c: The breast tissue is heterogeneously
dense, which may obscure small masses.
FINDINGS: In the right breast, a possible asymmetry warrants further
evaluation. In the left breast, no findings suspicious for
malignancy. Images were processed with CAD.
IMPRESSION: Further evaluation is suggested for possible asymmetry in the right
breast.

RECOMMENDATION:
Diagnostic mammogram and possibly ultrasound of the right breast.
(Code:EU-[id number])

The patient will be contacted regarding the findings, and additional
imaging will be scheduled.

BI-RADS CATEGORY  0: Incomplete. Need additional imaging evaluation
and/or prior mammograms for comparison.

## 2019-12-03 ENCOUNTER — Other Ambulatory Visit: Payer: Self-pay | Admitting: Family Medicine

## 2019-12-03 DIAGNOSIS — Z1231 Encounter for screening mammogram for malignant neoplasm of breast: Secondary | ICD-10-CM

## 2020-01-25 ENCOUNTER — Ambulatory Visit
Admission: RE | Admit: 2020-01-25 | Discharge: 2020-01-25 | Disposition: A | Discharge status: Home / Self Care | Payer: 59 | Source: Ambulatory Visit | Attending: Family Medicine | Admitting: Family Medicine

## 2020-01-25 DIAGNOSIS — Z1231 Encounter for screening mammogram for malignant neoplasm of breast: Secondary | ICD-10-CM

## 2020-01-27 ENCOUNTER — Ambulatory Visit: Payer: 59 | Admitting: Podiatry

## 2020-01-27 ENCOUNTER — Other Ambulatory Visit: Payer: Self-pay

## 2020-01-27 DIAGNOSIS — B351 Tinea unguium: Secondary | ICD-10-CM

## 2020-01-28 ENCOUNTER — Encounter: Payer: Self-pay | Admitting: Podiatry

## 2020-01-28 NOTE — Progress Notes (Signed)
  Subjective:  Patient ID: Alexandria Thompson, female    DOB: 11/18/1955,  MRN: [id number]  Chief Complaint  Patient presents with  . Nail Problem    Patient presents today for nail fungus all toes bilat feet x years.  She denies any pain or discomfort and says she has had laser treatment in the past.  She wants to discuss treatment options today   64 y.o. female returns for the above complaint.  Patient presents with complaint of mild onychomycosis to all of her toenails x10.  Patient states that they are not painful but she would like to discuss treatment options for them.  She does not want any medication by mouth or does not want to topically applied.  She denies any other acute complaints.  She has had laser treatment done in the past but was not able to follow-up accurately.  Objective:  There were no vitals filed for this visit. Podiatric Exam: Vascular: dorsalis pedis and posterior tibial pulses are palpable bilateral. Capillary return is immediate. Temperature gradient is WNL. Skin turgor WNL  Sensorium: Normal Semmes Weinstein monofilament test. Normal tactile sensation bilaterally. Nail Exam: Pt has thick disfigured discolored nails with subungual debris noted bilateral entire nail hallux through fifth toenails.  Pain on palpation to the nails. Ulcer Exam: There is no evidence of ulcer or pre-ulcerative changes or infection. Orthopedic Exam: Muscle tone and strength are WNL. No limitations in general ROM. No crepitus or effusions noted. HAV  B/L.  Hammer toes 2-5  B/L. Skin: No Porokeratosis. No infection or ulcers    Assessment & Plan:  No diagnosis found.  Patient was evaluated and treated and all questions answered.  Onychomycosis mild x10 -Educated the patient on the etiology of onychomycosis and various treatment options associated with improving the fungal load.  I explained to the patient that there is 3 treatment options available to treat the onychomycosis including  topical, p.o., laser treatment.  Patient elected to go laser therapy.  I discussed with her the financial cost and consideration.  Patient states understanding -Should be scheduled see Caryl Pina for laser therapy   Boneta Lucks, DPM    Return for please schedule with [facility] for laser.

## 2020-02-21 ENCOUNTER — Other Ambulatory Visit: Payer: 59

## 2020-02-25 ENCOUNTER — Ambulatory Visit: Payer: 59 | Admitting: *Deleted

## 2020-02-25 ENCOUNTER — Other Ambulatory Visit: Payer: Self-pay

## 2020-02-25 DIAGNOSIS — B351 Tinea unguium: Secondary | ICD-10-CM

## 2020-02-25 NOTE — Patient Instructions (Signed)

## 2020-02-25 NOTE — Progress Notes (Signed)
Patient presents today for the 1st laser treatment. Diagnosed with mycotic nail infection by Dr. Posey Pronto.   Toenail most affected are all ten, but very mild.  All other systems are negative.  Nails were filed thin. Laser therapy was administered to 1-5 toenails bilateral and patient tolerated the treatment well. All safety precautions were in place.    Follow up in 4 weeks for laser # 2.  Picture of nails taken today to document visual progress

## 2020-04-03 ENCOUNTER — Other Ambulatory Visit: Payer: Self-pay

## 2020-04-03 ENCOUNTER — Ambulatory Visit: Payer: 59 | Admitting: *Deleted

## 2020-04-03 DIAGNOSIS — B351 Tinea unguium: Secondary | ICD-10-CM

## 2020-04-03 NOTE — Progress Notes (Signed)
Patient presents today for the 2nd laser treatment. Diagnosed with mycotic nail infection by Dr. Posey Pronto.   Toenail most affected are all ten, but very mild.   All other systems are negative.  Nails were filed thin. Laser therapy was administered to 1-5 toenails bilateral and patient tolerated the treatment well. All safety precautions were in place.    Follow up in 4 weeks for laser # 3.

## 2020-05-12 ENCOUNTER — Other Ambulatory Visit: Payer: Self-pay

## 2020-05-12 ENCOUNTER — Ambulatory Visit (INDEPENDENT_AMBULATORY_CARE_PROVIDER_SITE_OTHER): Payer: 59 | Admitting: *Deleted

## 2020-05-12 DIAGNOSIS — B351 Tinea unguium: Secondary | ICD-10-CM

## 2020-05-12 NOTE — Progress Notes (Signed)
Patient presents today for the 3rd laser treatment. Diagnosed with mycotic nail infection by Dr. Posey Pronto.   Toenail most affected are all ten, but very mild. The are looking a little better.  All other systems are negative.  Nails were filed thin. Laser therapy was administered to 1-5 toenails bilateral and patient tolerated the treatment well. All safety precautions were in place.    Follow up in 6 weeks for laser # 4.

## 2020-06-23 ENCOUNTER — Ambulatory Visit (INDEPENDENT_AMBULATORY_CARE_PROVIDER_SITE_OTHER): Payer: 59 | Admitting: *Deleted

## 2020-06-23 ENCOUNTER — Other Ambulatory Visit: Payer: Self-pay

## 2020-06-23 DIAGNOSIS — B351 Tinea unguium: Secondary | ICD-10-CM

## 2020-06-23 NOTE — Progress Notes (Signed)
Patient presents today for the 4th laser treatment. Diagnosed with mycotic nail infection by Dr. Posey Pronto.   Toenail most affected are all ten, but very mild. The are looking a little better.  All other systems are negative.  Nails were filed thin. Laser therapy was administered to 1-5 toenails bilateral and patient tolerated the treatment well. All safety precautions were in place.    Follow up in 6 weeks for laser # 5.

## 2020-08-04 ENCOUNTER — Other Ambulatory Visit: Payer: Self-pay

## 2020-08-04 ENCOUNTER — Ambulatory Visit (INDEPENDENT_AMBULATORY_CARE_PROVIDER_SITE_OTHER): Payer: 59 | Admitting: *Deleted

## 2020-08-04 DIAGNOSIS — B351 Tinea unguium: Secondary | ICD-10-CM

## 2020-08-04 NOTE — Progress Notes (Signed)
Patient presents today for the 5th laser treatment. Diagnosed with mycotic nail infection by Dr. Posey Pronto.   Toenail most affected are all ten, but very mild. The are looking a little better.  All other systems are negative.  Nails were filed thin. Laser therapy was administered to 1-5 toenails bilateral and patient tolerated the treatment well. All safety precautions were in place.    Follow up in 8 weeks for laser # 6.   ~Take final pics next visit~

## 2020-09-29 ENCOUNTER — Other Ambulatory Visit: Payer: 59

## 2020-10-16 ENCOUNTER — Ambulatory Visit (INDEPENDENT_AMBULATORY_CARE_PROVIDER_SITE_OTHER): Payer: 59

## 2020-10-16 ENCOUNTER — Other Ambulatory Visit: Payer: Self-pay

## 2020-10-16 DIAGNOSIS — B351 Tinea unguium: Secondary | ICD-10-CM

## 2020-10-16 NOTE — Progress Notes (Signed)
Patient presents today for the 6th laser treatment. Diagnosed with mycotic nail infection by Dr. Posey Pronto.   Toenail most affected are all ten, but very mild. The are looking a little better.  All other systems are negative.  Nails were filed thin. Laser therapy was administered to 1-5 toenails bilateral and patient tolerated the treatment well. All safety precautions were in place.    Patient has completed the recommended laser treatments. He will follow up with Dr. Posey Pronto in 3 months to evaluate progress.

## 2021-01-25 ENCOUNTER — Other Ambulatory Visit: Payer: Self-pay

## 2021-01-25 ENCOUNTER — Ambulatory Visit: Payer: 59 | Admitting: Podiatry

## 2021-01-25 ENCOUNTER — Other Ambulatory Visit: Payer: Self-pay | Admitting: Family Medicine

## 2021-01-25 ENCOUNTER — Encounter: Payer: Self-pay | Admitting: Podiatry

## 2021-01-25 DIAGNOSIS — B351 Tinea unguium: Secondary | ICD-10-CM | POA: Diagnosis not present

## 2021-01-25 DIAGNOSIS — Z1231 Encounter for screening mammogram for malignant neoplasm of breast: Secondary | ICD-10-CM

## 2021-01-25 NOTE — Progress Notes (Signed)
  Subjective:  Patient ID: Alexandria Thompson, female    DOB: November 06, 1955,  MRN: [id number]  Chief Complaint  Patient presents with   Nail Problem    Laser treatment completed  PT stated that she feels like it helped    65 y.o. female returns for the above complaint.  Patient presents for follow-up of onychomycosis x10.  Patient states that laser treatment has helped considerably.  She states it definitely work.  She would like to discuss further and more preventive technique.  She would like to get on maintenance schedule for laser treatment. Objective:  There were no vitals filed for this visit. Podiatric Exam: Vascular: dorsalis pedis and posterior tibial pulses are palpable bilateral. Capillary return is immediate. Temperature gradient is WNL. Skin turgor WNL  Sensorium: Normal Semmes Weinstein monofilament test. Normal tactile sensation bilaterally. Nail Exam: Pt has thick disfigured discolored nails with subungual debris noted bilateral entire nail hallux through fifth toenails improving.  Pain on palpation to the nails improving Ulcer Exam: There is no evidence of ulcer or pre-ulcerative changes or infection. Orthopedic Exam: Muscle tone and strength are WNL. No limitations in general ROM. No crepitus or effusions noted. HAV  B/L.  Hammer toes 2-5  B/L. Skin: No Porokeratosis. No infection or ulcers    Assessment & Plan:   1. Onychomycosis due to dermatophyte   2. Nail fungus     Patient was evaluated and treated and all questions answered.  Onychomycosis mild x10 -Educated the patient on the etiology of onychomycosis and various treatment options associated with improving the fungal load.  I explained to the patient that there is 3 treatment options available to treat the onychomycosis including topical, p.o., laser treatment.  Patient elected to go laser therapy.  I discussed with her the financial cost and consideration.  Patient states understanding -She will be scheduled to  do laser every 3 months as a maintenance pulsed dosing.  I discussed this with the patient she states understand would like to proceed with that   Boneta Lucks, DPM    No follow-ups on file.

## 2021-02-01 ENCOUNTER — Ambulatory Visit
Admission: RE | Admit: 2021-02-01 | Discharge: 2021-02-01 | Disposition: A | Discharge status: Home / Self Care | Payer: 59 | Source: Ambulatory Visit | Attending: Family Medicine | Admitting: Family Medicine

## 2021-02-01 ENCOUNTER — Other Ambulatory Visit: Payer: Self-pay

## 2021-02-01 DIAGNOSIS — Z1231 Encounter for screening mammogram for malignant neoplasm of breast: Secondary | ICD-10-CM | POA: Diagnosis present

## 2021-02-09 ENCOUNTER — Ambulatory Visit (INDEPENDENT_AMBULATORY_CARE_PROVIDER_SITE_OTHER): Payer: 59 | Admitting: *Deleted

## 2021-02-09 ENCOUNTER — Other Ambulatory Visit: Payer: Self-pay

## 2021-02-09 DIAGNOSIS — B351 Tinea unguium: Secondary | ICD-10-CM

## 2021-02-09 NOTE — Progress Notes (Signed)
Patient presents today for laser nail maintenance. Diagnosed with mycotic nail infection by Dr. Patel.    Toenail most affected are all ten, but very mild.    Dr. Patel has suggested for her do do an every 3 month maintenance for preventative.   All other systems are negative.   Nails were filed thin. Laser therapy was administered to 1-5 toenails bilateral and patient tolerated the treatment well. All safety precautions were in place.      Patient will follow up in 3 months for maintenance. 

## 2021-05-18 ENCOUNTER — Ambulatory Visit (INDEPENDENT_AMBULATORY_CARE_PROVIDER_SITE_OTHER): Payer: 59 | Admitting: *Deleted

## 2021-05-18 ENCOUNTER — Other Ambulatory Visit: Payer: Self-pay

## 2021-05-18 DIAGNOSIS — B351 Tinea unguium: Secondary | ICD-10-CM

## 2021-05-18 NOTE — Progress Notes (Signed)
Patient presents today for laser nail maintenance. Diagnosed with mycotic nail infection by Dr. Patel.    Toenail most affected are all ten, but very mild.    Dr. Patel has suggested for her do do an every 3 month maintenance for preventative.   All other systems are negative.   Nails were filed thin. Laser therapy was administered to 1-5 toenails bilateral and patient tolerated the treatment well. All safety precautions were in place.      Patient will follow up in 3 months for maintenance. 

## 2021-08-13 ENCOUNTER — Ambulatory Visit: Payer: BC Managed Care – PPO | Admitting: Dermatology

## 2021-08-13 ENCOUNTER — Other Ambulatory Visit: Payer: Self-pay

## 2021-08-13 DIAGNOSIS — L820 Inflamed seborrheic keratosis: Secondary | ICD-10-CM | POA: Diagnosis not present

## 2021-08-13 DIAGNOSIS — D229 Melanocytic nevi, unspecified: Secondary | ICD-10-CM

## 2021-08-13 DIAGNOSIS — L578 Other skin changes due to chronic exposure to nonionizing radiation: Secondary | ICD-10-CM

## 2021-08-13 DIAGNOSIS — D224 Melanocytic nevi of scalp and neck: Secondary | ICD-10-CM | POA: Diagnosis not present

## 2021-08-13 DIAGNOSIS — D1800 Hemangioma unspecified site: Secondary | ICD-10-CM

## 2021-08-13 DIAGNOSIS — L918 Other hypertrophic disorders of the skin: Secondary | ICD-10-CM

## 2021-08-13 DIAGNOSIS — L814 Other melanin hyperpigmentation: Secondary | ICD-10-CM | POA: Diagnosis not present

## 2021-08-13 DIAGNOSIS — L821 Other seborrheic keratosis: Secondary | ICD-10-CM

## 2021-08-13 DIAGNOSIS — Z1283 Encounter for screening for malignant neoplasm of skin: Secondary | ICD-10-CM | POA: Diagnosis not present

## 2021-08-13 NOTE — Patient Instructions (Addendum)

## 2021-08-13 NOTE — Progress Notes (Signed)
New Patient Visit  Subjective  Alexandria Thompson is a 66 y.o. female who presents for the following: Annual Exam.  The patient presents for Total-Body Skin Exam (TBSE) for skin cancer screening and mole check.  The patient has spots, moles and lesions to be evaluated, some may be new or changing. She has growths on her left arm and back to check that is growing and gets itchy at times, and a couple of skin tags under her arm she would like removed.    The following portions of the chart were reviewed this encounter and updated as appropriate:       Review of Systems:  No other skin or systemic complaints except as noted in HPI or Assessment and Plan.  Objective  Well appearing patient in no apparent distress; mood and affect are within normal limits.  A full examination was performed including scalp, head, eyes, ears, nose, lips, neck, chest, axillae, abdomen, back, buttocks, bilateral upper extremities, bilateral lower extremities, hands, feet, fingers, toes, fingernails, and toenails. All findings within normal limits unless otherwise noted below.  Left Forearm x 1, R upper back x 1, Spinal upper back x 1 (3) Erythematous stuck-on, waxy papule or plaque  Occipital Scalp at hairline 5.0 x 3.36mm light tan macule    Assessment & Plan  Skin cancer screening performed today.  Actinic Damage - chronic, secondary to cumulative UV radiation exposure/sun exposure over time - diffuse scaly erythematous macules with underlying dyspigmentation - Recommend daily broad spectrum sunscreen SPF 30+ to sun-exposed areas, reapply every 2 hours as needed.  - Recommend staying in the shade or wearing long sleeves, sun glasses (UVA+UVB protection) and wide brim hats (4-inch brim around the entire circumference of the hat). - Call for new or changing lesions.  Lentigines - Scattered tan macules - Due to sun exposure - Benign-appering, observe - Recommend daily broad spectrum sunscreen SPF  30+ to sun-exposed areas, reapply every 2 hours as needed. - Call for any changes  Seborrheic Keratoses - Stuck-on, waxy, tan-brown papules and/or plaques  - Benign-appearing - Discussed benign etiology and prognosis. - Observe - Call for any changes  Hemangiomas - Red papules - Discussed benign nature - Observe - Call for any changes  Acrochordons (Skin Tags) - Removal desired by patient - Fleshy, skin-colored pedunculated papules - Benign appearing.  - Patient desires removal. Reviewed that this is not covered by insurance and they will be charged a cosmetic fee for removal. Patient signed non-covered consent.  - Prior to the procedure, reviewed the expected small wound. Also reviewed the risk of leaving a small scar and the small risk of infection.  PROCEDURE - The areas were prepped with isopropyl alcohol. A small amount of lidocaine 1% with epinephrine was injected at the base of each lesion to achieve good local anesthesia. The skin tags were removed using a snip technique. Aluminum chloride was used for hemostasis. Petrolatum and a bandage were applied. The procedure was tolerated well. - Wound care was reviewed with the patient. They were advised to call with any concerns. Total number of treated acrochordons 2  Melanocytic Nevi - Tan-brown and/or pink-flesh-colored symmetric macules and papules - Benign appearing on exam today - Observation - Call clinic for new or changing moles - Recommend daily use of broad spectrum spf 30+ sunscreen to sun-exposed areas.   Inflamed seborrheic keratosis (3) Left Forearm x 1, R upper back x 1, Spinal upper back x 1  Destruction of lesion - Left  Forearm x 1, R upper back x 1, Spinal upper back x 1  Destruction method: cryotherapy   Informed consent: discussed and consent obtained   Lesion destroyed using liquid nitrogen: Yes   Region frozen until ice ball extended beyond lesion: Yes   Outcome: patient tolerated procedure well with  no complications   Post-procedure details: wound care instructions given   Additional details:  Prior to procedure, discussed risks of blister formation, small wound, skin dyspigmentation, or rare scar following cryotherapy. Recommend Vaseline ointment to treated areas while healing.   Nevus Occipital Scalp at hairline  vs Lentigo  Benign-appearing.  Observation.  Call clinic for new or changing moles.  Recommend daily use of broad spectrum spf 30+ sunscreen to sun-exposed areas.    Return in about 1 year (around 08/13/2022) for TBSE.  IJamesetta Orleans, CMA, am acting as scribe for Brendolyn Patty, MD .  Documentation: I have reviewed the above documentation for accuracy and completeness, and I agree with the above.  Brendolyn Patty MD

## 2021-08-24 ENCOUNTER — Other Ambulatory Visit: Payer: Self-pay

## 2021-08-24 ENCOUNTER — Ambulatory Visit (INDEPENDENT_AMBULATORY_CARE_PROVIDER_SITE_OTHER): Payer: Self-pay

## 2021-08-24 DIAGNOSIS — B351 Tinea unguium: Secondary | ICD-10-CM

## 2021-08-24 NOTE — Progress Notes (Signed)
Patient presents today for laser nail maintenance. Diagnosed with mycotic nail infection by Dr. Patel.    Toenail most affected are all ten, but very mild.    Dr. Patel has suggested for her do do an every 3 month maintenance for preventative.   All other systems are negative.   Nails were filed thin. Laser therapy was administered to 1-5 toenails bilateral and patient tolerated the treatment well. All safety precautions were in place.      Patient will follow up in 3 months for maintenance. 

## 2021-11-16 ENCOUNTER — Ambulatory Visit (INDEPENDENT_AMBULATORY_CARE_PROVIDER_SITE_OTHER): Payer: Medicare Other | Admitting: Podiatry

## 2021-11-16 ENCOUNTER — Ambulatory Visit (INDEPENDENT_AMBULATORY_CARE_PROVIDER_SITE_OTHER): Payer: BC Managed Care – PPO

## 2021-11-16 DIAGNOSIS — B351 Tinea unguium: Secondary | ICD-10-CM

## 2021-11-16 NOTE — Progress Notes (Signed)
Patient presents today for laser nail maintenance. Diagnosed with mycotic nail infection by Dr. Patel.    Toenail most affected are all ten, but very mild.    Dr. Patel has suggested for her do do an every 3 month maintenance for preventative.   All other systems are negative.   Nails were filed thin. Laser therapy was administered to 1-5 toenails bilateral and patient tolerated the treatment well. All safety precautions were in place.      Patient will follow up in 3 months for maintenance. 

## 2022-02-15 ENCOUNTER — Ambulatory Visit (INDEPENDENT_AMBULATORY_CARE_PROVIDER_SITE_OTHER): Payer: Self-pay | Admitting: Podiatry

## 2022-02-15 DIAGNOSIS — B351 Tinea unguium: Secondary | ICD-10-CM

## 2022-02-15 NOTE — Progress Notes (Signed)
Patient presents today for laser nail maintenance. Diagnosed with mycotic nail infection by Dr. Posey Pronto.    Toenail most affected are all ten, but very mild.    Dr. Posey Pronto has suggested for her do do an every 3 month maintenance for preventative.   All other systems are negative.   Nails were filed thin. Laser therapy was administered to 1-5 toenails bilateral and patient tolerated the treatment well. All safety precautions were in place.      Patient will follow up in 3 months for maintenance.

## 2022-05-17 ENCOUNTER — Ambulatory Visit (INDEPENDENT_AMBULATORY_CARE_PROVIDER_SITE_OTHER): Payer: Medicare Other

## 2022-05-17 DIAGNOSIS — B351 Tinea unguium: Secondary | ICD-10-CM

## 2022-05-17 NOTE — Progress Notes (Signed)
Patient presents today for laser nail maintenance. Diagnosed with mycotic nail infection by Dr. Posey Pronto.    Toenail most affected are all ten, but very mild.    Dr. Posey Pronto has suggested for her do do an every 3 month maintenance for preventative.   All other systems are negative.   Nails were filed thin. Laser therapy was administered to 1-5 toenails bilateral and patient tolerated the treatment well. All safety precautions were in place.      Patient will follow up 1 week with Dr. Posey Pronto for

## 2022-05-29 ENCOUNTER — Ambulatory Visit: Payer: BC Managed Care – PPO | Admitting: Podiatry

## 2022-05-29 DIAGNOSIS — B351 Tinea unguium: Secondary | ICD-10-CM | POA: Diagnosis not present

## 2022-05-29 NOTE — Progress Notes (Signed)
  Subjective:  Patient ID: Alexandria Thompson, female    DOB: June 30, 1956,  MRN: [id number]  Chief Complaint  Patient presents with   Nail Problem    Nail fungus. Follow up after laser treatments    66 y.o. female returns for the above complaint.  Patient presents for follow-up of onychomycosis x10.  Patient states that laser treatment has helped considerably.  She has been doing maintenance laser.  She would like to continue doing that.  The nails have not gotten worse.  Denies any other acute complaints Objective:  There were no vitals filed for this visit. Podiatric Exam: Vascular: dorsalis pedis and posterior tibial pulses are palpable bilateral. Capillary return is immediate. Temperature gradient is WNL. Skin turgor WNL  Sensorium: Normal Semmes Weinstein monofilament test. Normal tactile sensation bilaterally. Nail Exam: Pt has thick disfigured discolored nails with subungual debris noted bilateral entire nail hallux through fifth toenails improving.  Pain on palpation to the nails improving Ulcer Exam: There is no evidence of ulcer or pre-ulcerative changes or infection. Orthopedic Exam: Muscle tone and strength are WNL. No limitations in general ROM. No crepitus or effusions noted. HAV  B/L.  Hammer toes 2-5  B/L. Skin: No Porokeratosis. No infection or ulcers    Assessment & Plan:   No diagnosis found.   Patient was evaluated and treated and all questions answered.  Onychomycosis mild x10 -Educated the patient on the etiology of onychomycosis and various treatment options associated with improving the fungal load.  I explained to the patient that there is 3 treatment options available to treat the onychomycosis including topical, p.o., laser treatment.  Patient elected to go laser therapy.  I discussed with her the financial cost and consideration.  Patient states understanding -We will continue to do maintenance laser every 3 months or so.  Patient states understanding like to  proceed with   Boneta Lucks, DPM    No follow-ups on file.

## 2022-07-03 ENCOUNTER — Other Ambulatory Visit: Payer: Self-pay | Admitting: Family Medicine

## 2022-07-03 DIAGNOSIS — Z1231 Encounter for screening mammogram for malignant neoplasm of breast: Secondary | ICD-10-CM

## 2022-08-13 ENCOUNTER — Ambulatory Visit: Payer: BC Managed Care – PPO | Admitting: Dermatology

## 2022-08-13 VITALS — BP 110/60 | HR 55

## 2022-08-13 DIAGNOSIS — L821 Other seborrheic keratosis: Secondary | ICD-10-CM

## 2022-08-13 DIAGNOSIS — D229 Melanocytic nevi, unspecified: Secondary | ICD-10-CM

## 2022-08-13 DIAGNOSIS — Z808 Family history of malignant neoplasm of other organs or systems: Secondary | ICD-10-CM

## 2022-08-13 DIAGNOSIS — L820 Inflamed seborrheic keratosis: Secondary | ICD-10-CM

## 2022-08-13 DIAGNOSIS — Z1283 Encounter for screening for malignant neoplasm of skin: Secondary | ICD-10-CM

## 2022-08-13 DIAGNOSIS — L814 Other melanin hyperpigmentation: Secondary | ICD-10-CM

## 2022-08-13 DIAGNOSIS — D224 Melanocytic nevi of scalp and neck: Secondary | ICD-10-CM

## 2022-08-13 DIAGNOSIS — L578 Other skin changes due to chronic exposure to nonionizing radiation: Secondary | ICD-10-CM

## 2022-08-13 NOTE — Progress Notes (Signed)
Follow-Up Visit   Subjective  Alexandria Thompson is a 67 y.o. female who presents for the following: Annual Exam (1 year tbse, hx of isk, reports a spot at back she would like checked ).  Gets irritated by her bra.  The patient presents for Total-Body Skin Exam (TBSE) for skin cancer screening and mole check.  The patient has spots, moles and lesions to be evaluated, some may be new or changing and the patient has concerns that these could be cancer.    The following portions of the chart were reviewed this encounter and updated as appropriate:      Review of Systems: No other skin or systemic complaints except as noted in HPI or Assessment and Plan.   Objective  Well appearing patient in no apparent distress; mood and affect are within normal limits.  A full examination was performed including scalp, head, eyes, ears, nose, lips, neck, chest, axillae, abdomen, back, buttocks, bilateral upper extremities, bilateral lower extremities, hands, feet, fingers, toes, fingernails, and toenails. All findings within normal limits unless otherwise noted below.  left upper back at braline x 1 Erythematous stuck-on, waxy papule   Mid Occipital Scalp at hairline 5 x 3 mm light tan macule    Assessment & Plan  Inflamed seborrheic keratosis left upper back at braline x 1  Symptomatic, irritating, patient would like treated.  Destruction of lesion - left upper back at braline x 1  Destruction method: cryotherapy   Informed consent: discussed and consent obtained   Lesion destroyed using liquid nitrogen: Yes   Region frozen until ice ball extended beyond lesion: Yes   Outcome: patient tolerated procedure well with no complications   Post-procedure details: wound care instructions given   Additional details:  Prior to procedure, discussed risks of blister formation, small wound, skin dyspigmentation, or rare scar following cryotherapy. Recommend Vaseline ointment to treated areas while  healing.   Nevus Mid Occipital Scalp at hairline  Vs lentigo  Benign-appearing. Stable compared to previous visit. Observation.  Call clinic for new or changing moles.  Recommend daily use of broad spectrum spf 30+ sunscreen to sun-exposed areas.     Lentigines - Scattered tan macules - Due to sun exposure - Benign-appearing, observe - Recommend daily broad spectrum sunscreen SPF 30+ to sun-exposed areas, reapply every 2 hours as needed. - Call for any changes  Seborrheic Keratoses  - Stuck-on, waxy, tan-brown papules and/or plaques  - Benign-appearing - Discussed benign etiology and prognosis. - Observe - Call for any changes  Melanocytic Nevi - Tan-brown and/or pink-flesh-colored symmetric macules and papules - Benign appearing on exam today - Observation - Call clinic for new or changing moles - Recommend daily use of broad spectrum spf 30+ sunscreen to sun-exposed areas.   Hemangiomas - Red papules - Discussed benign nature - Observe - Call for any changes  Actinic Damage - Chronic condition, secondary to cumulative UV/sun exposure - diffuse scaly erythematous macules with underlying dyspigmentation - Recommend daily broad spectrum sunscreen SPF 30+ to sun-exposed areas, reapply every 2 hours as needed.  - Staying in the shade or wearing long sleeves, sun glasses (UVA+UVB protection) and wide brim hats (4-inch brim around the entire circumference of the hat) are also recommended for sun protection.  - Call for new or changing lesions.  Family history of skin cancer - what type(s):  bcc  - who affected: father and mother  Skin cancer screening performed today. Return in about 1 year (around 08/14/2023) for  TBSE.  I, Ruthell Rummage, CMA, am acting as scribe for Brendolyn Patty, MD.  Documentation: I have reviewed the above documentation for accuracy and completeness, and I agree with the above.  Brendolyn Patty MD

## 2022-08-13 NOTE — Patient Instructions (Addendum)
Seborrheic Keratosis  What causes seborrheic keratoses? Seborrheic keratoses are harmless, common skin growths that first appear during adult life.  As time goes by, more growths appear.  Some people may develop a large number of them.  Seborrheic keratoses appear on both covered and uncovered body parts.  They are not caused by sunlight.  The tendency to develop seborrheic keratoses can be inherited.  They vary in color from skin-colored to gray, brown, or even black.  They can be either smooth or have a rough, warty surface.   Seborrheic keratoses are superficial and look as if they were stuck on the skin.  Under the microscope this type of keratosis looks like layers upon layers of skin.  That is why at times the top layer may seem to fall off, but the rest of the growth remains and re-grows.    Treatment Seborrheic keratoses do not need to be treated, but can easily be removed in the office.  Seborrheic keratoses often cause symptoms when they rub on clothing or jewelry.  Lesions can be in the way of shaving.  If they become inflamed, they can cause itching, soreness, or burning.  Removal of a seborrheic keratosis can be accomplished by freezing, burning, or surgery. If any spot bleeds, scabs, or grows rapidly, please return to have it checked, as these can be an indication of a skin cancer.  Cryotherapy Aftercare  Wash gently with soap and water everyday.   Apply Vaseline and Band-Aid daily until healed.       Melanoma ABCDEs  Melanoma is the most dangerous type of skin cancer, and is the leading cause of death from skin disease.  You are more likely to develop melanoma if you: Have light-colored skin, light-colored eyes, or red or blond hair Spend a lot of time in the sun Tan regularly, either outdoors or in a tanning bed Have had blistering sunburns, especially during childhood Have a close family member who has had a melanoma Have atypical moles or large birthmarks  Early  detection of melanoma is key since treatment is typically straightforward and cure rates are extremely high if we catch it early.   The first sign of melanoma is often a change in a mole or a new dark spot.  The ABCDE system is a way of remembering the signs of melanoma.  A for asymmetry:  The two halves do not match. B for border:  The edges of the growth are irregular. C for color:  A mixture of colors are present instead of an even brown color. D for diameter:  Melanomas are usually (but not always) greater than 6mm - the size of a pencil eraser. E for evolution:  The spot keeps changing in size, shape, and color.  Please check your skin once per month between visits. You can use a small mirror in front and a large mirror behind you to keep an eye on the back side or your body.   If you see any new or changing lesions before your next follow-up, please call to schedule a visit.  Please continue daily skin protection including broad spectrum sunscreen SPF 30+ to sun-exposed areas, reapplying every 2 hours as needed when you're outdoors.   Staying in the shade or wearing long sleeves, sun glasses (UVA+UVB protection) and wide brim hats (4-inch brim around the entire circumference of the hat) are also recommended for sun protection.     Due to recent changes in healthcare laws, you may see results of   your pathology and/or laboratory studies on MyChart before the doctors have had a chance to review them. We understand that in some cases there may be results that are confusing or concerning to you. Please understand that not all results are received at the same time and often the doctors may need to interpret multiple results in order to provide you with the best plan of care or course of treatment. Therefore, we ask that you please give us 2 business days to thoroughly review all your results before contacting the office for clarification. Should we see a critical lab result, you will be contacted  sooner.   If You Need Anything After Your Visit  If you have any questions or concerns for your doctor, please call our main line at [phone] and press option 4 to reach your doctor's medical assistant. If no one answers, please leave a voicemail as directed and we will return your call as soon as possible. Messages left after 4 pm will be answered the following business day.   You may also send us a message via MyChart. We typically respond to MyChart messages within 1-2 business days.  For prescription refills, please ask your pharmacy to contact our office. Our fax number is [phone].  If you have an urgent issue when the clinic is closed that cannot wait until the next business day, you can page your doctor at the number below.    Please note that while we do our best to be available for urgent issues outside of office hours, we are not available 24/7.   If you have an urgent issue and are unable to reach us, you may choose to seek medical care at your doctor's office, retail clinic, urgent care center, or emergency room.  If you have a medical emergency, please immediately call 911 or go to the emergency department.  Pager Numbers  - Dr. Kowalski: [phone]  - Dr. Moye: [phone]  - Dr. Stewart: [phone]  In the event of inclement weather, please call our main line at [phone] for an update on the status of any delays or closures.  Dermatology Medication Tips: Please keep the boxes that topical medications come in in order to help keep track of the instructions about where and how to use these. Pharmacies typically print the medication instructions only on the boxes and not directly on the medication tubes.   If your medication is too expensive, please contact our office at [phone] option 4 or send us a message through MyChart.   We are unable to tell what your co-pay for medications will be in advance as this is different depending on your insurance  coverage. However, we may be able to find a substitute medication at lower cost or fill out paperwork to get insurance to cover a needed medication.   If a prior authorization is required to get your medication covered by your insurance company, please allow us 1-2 business days to complete this process.  Drug prices often vary depending on where the prescription is filled and some pharmacies may offer cheaper prices.  The website www.goodrx.com contains coupons for medications through different pharmacies. The prices here do not account for what the cost may be with help from insurance (it may be cheaper with your insurance), but the website can give you the price if you did not use any insurance.  - You can print the associated coupon and take it with your prescription to the pharmacy.  - You may also stop   by our office during regular business hours and pick up a GoodRx coupon card.  - If you need your prescription sent electronically to a different pharmacy, notify our office through [facility] MyChart or by phone at [phone] option 4.     Si Usted Necesita Algo Despus de Su Visita  Tambin puede enviarnos un mensaje a travs de MyChart. Por lo general respondemos a los mensajes de MyChart en el transcurso de 1 a 2 das hbiles.  Para renovar recetas, por favor pida a su farmacia que se ponga en contacto con nuestra oficina. Nuestro nmero de fax es el [phone].  Si tiene un asunto urgente cuando la clnica est cerrada y que no puede esperar hasta el siguiente da hbil, puede llamar/localizar a su doctor(a) al nmero que aparece a continuacin.   Por favor, tenga en cuenta que aunque hacemos todo lo posible para estar disponibles para asuntos urgentes fuera del horario de oficina, no estamos disponibles las 24 horas del da, los 7 das de la semana.   Si tiene un problema urgente y no puede comunicarse con nosotros, puede optar por buscar atencin mdica  en el consultorio de  su doctor(a), en una clnica privada, en un centro de atencin urgente o en una sala de emergencias.  Si tiene una emergencia mdica, por favor llame inmediatamente al 911 o vaya a la sala de emergencias.  Nmeros de bper  - Dr. Kowalski: [phone]  - Dra. Moye: [phone]  - Dra. Stewart: [phone]  En caso de inclemencias del tiempo, por favor llame a nuestra lnea principal al [phone] para una actualizacin sobre el estado de cualquier retraso o cierre.  Consejos para la medicacin en dermatologa: Por favor, guarde las cajas en las que vienen los medicamentos de uso tpico para ayudarle a seguir las instrucciones sobre dnde y cmo usarlos. Las farmacias generalmente imprimen las instrucciones del medicamento slo en las cajas y no directamente en los tubos del medicamento.   Si su medicamento es muy caro, por favor, pngase en contacto con nuestra oficina llamando al [phone] y presione la opcin 4 o envenos un mensaje a travs de MyChart.   No podemos decirle cul ser su copago por los medicamentos por adelantado ya que esto es diferente dependiendo de la cobertura de su seguro. Sin embargo, es posible que podamos encontrar un medicamento sustituto a menor costo o llenar un formulario para que el seguro cubra el medicamento que se considera necesario.   Si se requiere una autorizacin previa para que su compaa de seguros cubra su medicamento, por favor permtanos de 1 a 2 das hbiles para completar este proceso.  Los precios de los medicamentos varan con frecuencia dependiendo del lugar de dnde se surte la receta y alguna farmacias pueden ofrecer precios ms baratos.  El sitio web www.goodrx.com tiene cupones para medicamentos de diferentes farmacias. Los precios aqu no tienen en cuenta lo que podra costar con la ayuda del seguro (puede ser ms barato con su seguro), pero el sitio web puede darle el precio si no utiliz ningn seguro.  - Puede imprimir el  cupn correspondiente y llevarlo con su receta a la farmacia.  - Tambin puede pasar por nuestra oficina durante el horario de atencin regular y recoger una tarjeta de cupones de GoodRx.  - Si necesita que su receta se enve electrnicamente a una farmacia diferente, informe a nuestra oficina a travs de MyChart de [facility] o por telfono llamando al [phone] y presione la opcin 4.  

## 2022-08-15 ENCOUNTER — Ambulatory Visit
Admission: RE | Admit: 2022-08-15 | Discharge: 2022-08-15 | Disposition: A | Discharge status: Home / Self Care | Payer: BC Managed Care – PPO | Source: Ambulatory Visit | Attending: Family Medicine | Admitting: Family Medicine

## 2022-08-15 DIAGNOSIS — Z1231 Encounter for screening mammogram for malignant neoplasm of breast: Secondary | ICD-10-CM | POA: Diagnosis present

## 2022-10-11 ENCOUNTER — Other Ambulatory Visit: Payer: Medicare Other

## 2022-10-30 ENCOUNTER — Ambulatory Visit: Payer: Medicare Other | Admitting: Podiatry

## 2022-11-11 ENCOUNTER — Ambulatory Visit (INDEPENDENT_AMBULATORY_CARE_PROVIDER_SITE_OTHER): Payer: Medicare Other | Admitting: *Deleted

## 2022-11-11 DIAGNOSIS — B351 Tinea unguium: Secondary | ICD-10-CM

## 2022-11-11 NOTE — Progress Notes (Signed)
Patient presents today for laser nail maintenance. Diagnosed with mycotic nail infection by Dr. Allena Katz.    Toenail most affected are all ten, but very mild. She is mostly concerned about the 2nd nails bilateral, however, I did explain to her that I thought this was more of a dystrophy as opposed to a fungal infection.   Dr. Allena Katz has suggested for her do do an every 3 month maintenance for preventative.   All other systems are negative.   Nails were filed thin. Laser therapy was administered to 1-5 toenails bilateral and patient tolerated the treatment well. All safety precautions were in place.    I recommended she could try buying a dremmel instrument and using that to keep the nails files thin. She said she may give this a try.   She will follow up in 3 months for another laser treatment.

## 2022-12-13 ENCOUNTER — Other Ambulatory Visit: Payer: Medicare Other

## 2023-02-14 ENCOUNTER — Other Ambulatory Visit: Payer: Medicare Other

## 2023-02-28 ENCOUNTER — Ambulatory Visit (INDEPENDENT_AMBULATORY_CARE_PROVIDER_SITE_OTHER): Payer: Medicare Other | Admitting: Podiatrist

## 2023-02-28 DIAGNOSIS — B351 Tinea unguium: Secondary | ICD-10-CM

## 2023-02-28 NOTE — Progress Notes (Signed)
Patient presents today for laser nail maintenance. Diagnosed with mycotic nail infection by Dr. Allena Katz.    Toenail most affected are all ten, but very mild. She is mostly concerned about the 2nd nails bilateral.  She relates the nails have been improving with each visit and she is happy with the 3 month preventive treatments.     Dr. Allena Katz has suggested for her do do an every 3 month maintenance for preventative.   All other systems are negative.   Nails were filed thin. Laser therapy was administered to 1-5 toenails bilateral and patient tolerated the treatment well. All safety precautions were in place.    She will follow up in 3 months for another laser treatment.

## 2023-04-23 LAB — COLOGUARD: COLOGUARD: NEGATIVE

## 2023-04-23 LAB — EXTERNAL GENERIC LAB PROCEDURE: COLOGUARD: NEGATIVE

## 2023-06-06 ENCOUNTER — Ambulatory Visit (INDEPENDENT_AMBULATORY_CARE_PROVIDER_SITE_OTHER): Payer: Medicare Other | Admitting: Podiatrist

## 2023-06-06 DIAGNOSIS — B351 Tinea unguium: Secondary | ICD-10-CM

## 2023-06-06 NOTE — Progress Notes (Signed)
Patient presents today for laser nail maintenance. Diagnosed with mycotic nail infection by Dr. Allena Katz.    Toenail most affected are all ten, but very mild. She is mostly concerned about the 2nd nails bilateral.  She relates the nails have been improving with each visit and she is happy with the 3 month preventive treatments.      All other systems are negative.   Nails were filed trimmed back just a little with a sterile nail nipper and smoothed with a sterile burr.   Laser therapy was administered to 1-5 toenails bilateral and patient tolerated the treatment well.   All safety precautions were in place.    She will follow up in 3 months for another laser treatment.

## 2023-08-19 ENCOUNTER — Ambulatory Visit (INDEPENDENT_AMBULATORY_CARE_PROVIDER_SITE_OTHER): Payer: Medicare Other | Admitting: Dermatology

## 2023-08-19 DIAGNOSIS — L719 Rosacea, unspecified: Secondary | ICD-10-CM

## 2023-08-19 DIAGNOSIS — D1801 Hemangioma of skin and subcutaneous tissue: Secondary | ICD-10-CM

## 2023-08-19 DIAGNOSIS — L814 Other melanin hyperpigmentation: Secondary | ICD-10-CM

## 2023-08-19 DIAGNOSIS — L821 Other seborrheic keratosis: Secondary | ICD-10-CM

## 2023-08-19 DIAGNOSIS — W908XXA Exposure to other nonionizing radiation, initial encounter: Secondary | ICD-10-CM | POA: Diagnosis not present

## 2023-08-19 DIAGNOSIS — D229 Melanocytic nevi, unspecified: Secondary | ICD-10-CM

## 2023-08-19 DIAGNOSIS — Z808 Family history of malignant neoplasm of other organs or systems: Secondary | ICD-10-CM

## 2023-08-19 DIAGNOSIS — Z1283 Encounter for screening for malignant neoplasm of skin: Secondary | ICD-10-CM

## 2023-08-19 DIAGNOSIS — L578 Other skin changes due to chronic exposure to nonionizing radiation: Secondary | ICD-10-CM | POA: Diagnosis not present

## 2023-08-19 DIAGNOSIS — D2272 Melanocytic nevi of left lower limb, including hip: Secondary | ICD-10-CM

## 2023-08-19 NOTE — Patient Instructions (Addendum)

## 2023-08-19 NOTE — Progress Notes (Signed)
 Follow-Up Visit   Subjective  Alexandria Thompson is a 68 y.o. female who presents for the following: Skin Cancer Screening and Full Body Skin Exam. No personal hx of skin cancer   The patient presents for Total-Body Skin Exam (TBSE) for skin cancer screening and mole check. The patient has spots, moles and lesions to be evaluated, some may be new or changing and the patient may have concern these could be cancer.   The following portions of the chart were reviewed this encounter and updated as appropriate: medications, allergies, medical history  Review of Systems:  No other skin or systemic complaints except as noted in HPI or Assessment and Plan.  Objective  Well appearing patient in no apparent distress; mood and affect are within normal limits.  A full examination was performed including scalp, head, eyes, ears, nose, lips, neck, chest, axillae, abdomen, back, buttocks, bilateral upper extremities, bilateral lower extremities, hands, feet, fingers, toes, fingernails, and toenails. All findings within normal limits unless otherwise noted below.   Relevant physical exam findings are noted in the Assessment and Plan.    Assessment & Plan   SKIN CANCER SCREENING PERFORMED TODAY.  ACTINIC DAMAGE - Chronic condition, secondary to cumulative UV/sun exposure - diffuse scaly erythematous macules with underlying dyspigmentation - Recommend daily broad spectrum sunscreen SPF 30+ to sun-exposed areas, reapply every 2 hours as needed.  - Staying in the shade or wearing long sleeves, sun glasses (UVA+UVB protection) and wide brim hats (4-inch brim around the entire circumference of the hat) are also recommended for sun protection.  - Call for new or changing lesions.  LENTIGINES, SEBORRHEIC KERATOSES, HEMANGIOMAS - Benign normal skin lesions - Benign-appearing - Call for any changes  SK vs Nevus  Mid crown scalp 7mm waxy tan papule   Benign-appearing.  Observation.  Call clinic for  new or changing moles.  Recommend daily use of broad spectrum spf 30+ sunscreen to sun-exposed areas.     MELANOCYTIC NEVI - Tan-brown and/or pink-flesh-colored symmetric macules and papules - L Foot dorsum 1mm medium dark brown macule - Benign appearing on exam today - Observation - Call clinic for new or changing moles - Recommend daily use of broad spectrum spf 30+ sunscreen to sun-exposed areas.    Nevus vs Lentigo -Mid Occipital Scalp at hairline 5 x 3 mm light brown macule   Benign-appearing. Stable compared to previous visit. Observation.  Call clinic for new or changing moles.  Recommend daily use of broad spectrum spf 30+ sunscreen to sun-exposed areas.     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 Currently well-controlled. Not bothersome for patient.   Rosacea is a chronic progressive skin condition usually affecting the face of adults, causing redness and/or acne bumps. It is treatable but not curable. It sometimes affects the eyes (ocular rosacea) as well. It may respond to topical and/or systemic medication and can flare with stress, sun exposure, alcohol, exercise, topical steroids (including hydrocortisone/cortisone 10) and some foods.  Daily application of broad spectrum spf 30+ sunscreen to face is recommended to reduce flares.   Treatment Plan Mild. Continue using daily sunscreen     Return in about 1 year (around 08/18/2024) for TBSE, w/ Dr. Jackquline.  I, Jacquelynn Vera, CMA, am acting as scribe for Rexene Jackquline, MD .   Documentation:  I have reviewed the above documentation for accuracy and completeness, and I agree with the above.  Rexene Rattler, MD

## 2023-09-12 ENCOUNTER — Ambulatory Visit: Payer: Self-pay | Admitting: Podiatrist

## 2023-09-12 DIAGNOSIS — B351 Tinea unguium: Secondary | ICD-10-CM

## 2023-09-12 NOTE — Progress Notes (Signed)
 Patient presents today for laser nail maintenance. Diagnosed with mycotic nail infection by Dr. Allena Katz.    Toenail most affected are all ten, but very mild. She is mostly concerned about the 2nd nails bilateral.  She relates the nails have been improving with each visit and she is happy with the 3 month preventive treatments.      All other systems are negative.   Nails were trimmed back just a little with a sterile nail nipper and smoothed with a sterile burr.   Laser therapy was administered to 1-5 toenails bilateral and patient tolerated the treatment well.   All safety precautions were in place.    She will follow up in 3 months for another laser treatment.

## 2023-09-26 ENCOUNTER — Other Ambulatory Visit: Payer: Self-pay | Admitting: Family Medicine

## 2023-09-26 DIAGNOSIS — Z1231 Encounter for screening mammogram for malignant neoplasm of breast: Secondary | ICD-10-CM

## 2023-10-29 ENCOUNTER — Ambulatory Visit
Admission: RE | Admit: 2023-10-29 | Discharge: 2023-10-29 | Disposition: A | Discharge status: Home / Self Care | Source: Ambulatory Visit | Attending: Family Medicine | Admitting: Family Medicine

## 2023-10-29 DIAGNOSIS — Z1231 Encounter for screening mammogram for malignant neoplasm of breast: Secondary | ICD-10-CM | POA: Insufficient documentation

## 2023-11-28 ENCOUNTER — Ambulatory Visit (INDEPENDENT_AMBULATORY_CARE_PROVIDER_SITE_OTHER): Admitting: Podiatrist

## 2023-11-28 DIAGNOSIS — B351 Tinea unguium: Secondary | ICD-10-CM

## 2023-11-28 DIAGNOSIS — L03031 Cellulitis of right toe: Secondary | ICD-10-CM

## 2023-11-28 MED ORDER — MUPIROCIN 2 % EX OINT: 1.0000 | TOPICAL_OINTMENT | Freq: Two times a day (BID) | CUTANEOUS | 2 refills | Status: DC

## 2023-11-28 MED ORDER — DOXYCYCLINE HYCLATE 100 MG PO TABS: 100.0000 mg | ORAL_TABLET | Freq: Two times a day (BID) | ORAL | 0 refills | Status: DC

## 2023-11-28 NOTE — Progress Notes (Signed)
 Patient presents today for laser nail maintenance. Diagnosed with mycotic nail infection by Dr. Lydia Sams.    Toenail most affected are all ten, but very mild. She is mostly concerned about the 2nd nails bilateral.  She relates the nails have been improving with each visit and she is happy with the 3 month preventive treatments.    Right third toenail is painful on the lateral corner - she had a pedicure before her sons wedding and states she experienced pain and redness on the corner of the nail afterwards. Has been soaking in epsom salt soaks.    All other systems are negative.   Left thirs lateral corner was trimmed back just a little with a sterile nail nipper and smoothed with a sterile burr.   Laser therapy was administered to 1-5 toenails bilateral and patient tolerated the treatment well.   All safety precautions were in place.    She will follow up in 3 months for another laser treatment.  Rx for mupirocin ointment and doxycycline called in as a courtesy-  if no improvement in 3-4 days she will call to see us  to fix this nail.

## 2023-12-19 ENCOUNTER — Other Ambulatory Visit: Payer: Medicare Other

## 2023-12-23 ENCOUNTER — Ambulatory Visit (INDEPENDENT_AMBULATORY_CARE_PROVIDER_SITE_OTHER): Admitting: Podiatry

## 2023-12-23 DIAGNOSIS — L600 Ingrowing nail: Secondary | ICD-10-CM

## 2023-12-23 NOTE — Progress Notes (Signed)
  Subjective:  Patient ID: Alexandria Thompson, female    DOB: 09-28-1955,  MRN: [id number]  Chief Complaint  Patient presents with   Nail Problem    68 y.o. female presents with the above complaint.  Patient presents with right third digit medial border ingrown painful to touch is progressive gotten worse worse with ambulation is with pressure she would like to have it removed it just came out of nowhere.  It has been irritated.  She has been applying mupirocin  ointment which helps a little bit but she would like to have it removed.  Pain scale 5 out of 10 dull aching nature   Review of Systems: Negative except as noted in the HPI. Denies N/V/F/Ch.  Past Medical History:  Diagnosis Date   Anxiety     Current Outpatient Medications:    ALPRAZolam (XANAX) 0.25 MG tablet, Take by mouth., Disp: , Rfl:    doxycycline  (VIBRA -TABS) 100 MG tablet, Take 1 tablet (100 mg total) by mouth 2 (two) times daily., Disp: 20 tablet, Rfl: 0   levothyroxine (SYNTHROID, LEVOTHROID) 88 MCG tablet, Take by mouth., Disp: , Rfl:    Multiple Vitamin (MULTI-VITAMINS) TABS, Take by mouth., Disp: , Rfl:    mupirocin  ointment (BACTROBAN ) 2 %, Apply 1 Application topically 2 (two) times daily. Apply to sore toe twice daily, Disp: 30 g, Rfl: 2   rosuvastatin (CRESTOR) 5 MG tablet, Take 5 mg by mouth daily., Disp: , Rfl:    sertraline (ZOLOFT) 100 MG tablet, Take by mouth., Disp: , Rfl:   Social History   Tobacco Use  Smoking Status Never  Smokeless Tobacco Not on file    Allergies  Allergen Reactions   Codeine Hives and Nausea Only   Objective:  There were no vitals filed for this visit. There is no height or weight on file to calculate BMI. Constitutional Well developed. Well nourished.  Vascular Dorsalis pedis pulses palpable bilaterally. Posterior tibial pulses palpable bilaterally. Capillary refill normal to all digits.  No cyanosis or clubbing noted. Pedal hair growth normal.  Neurologic Normal  speech. Oriented to person, place, and time. Epicritic sensation to light touch grossly present bilaterally.  Dermatologic Painful ingrowing nail at medial nail borders of the third digit nail right. No other open wounds. No skin lesions.  Orthopedic: Normal joint ROM without pain or crepitus bilaterally. No visible deformities. No bony tenderness.   Radiographs: None Assessment:   1. Ingrown nail of third toe of right foot    Plan:  Patient was evaluated and treated and all questions answered.  Ingrown Nail, right -Patient elects to proceed with minor surgery to remove ingrown toenail removal today. Consent reviewed and signed by patient. -Ingrown nail excised. See procedure note. -Educated on post-procedure care including soaking. Written instructions provided and reviewed. -Patient to follow up in 2 weeks for nail check.  Procedure: Excision of Ingrown Toenail Location: Right 3rd toe medial nail borders. Anesthesia: Lidocaine 1% plain; 1.5 mL and Marcaine 0.5% plain; 1.5 mL, digital block. Skin Prep: Betadine. Dressing: Silvadene; telfa; dry, sterile, compression dressing. Technique: Following skin prep, the toe was exsanguinated and a tourniquet was secured at the base of the toe. The affected nail border was freed, split with a nail splitter, and excised. Chemical matrixectomy was then performed with phenol and irrigated out with alcohol. The tourniquet was then removed and sterile dressing applied. Disposition: Patient tolerated procedure well. Patient to return in 2 weeks for follow-up.   No follow-ups on file.

## 2024-03-05 ENCOUNTER — Ambulatory Visit (INDEPENDENT_AMBULATORY_CARE_PROVIDER_SITE_OTHER): Payer: Self-pay

## 2024-03-05 DIAGNOSIS — B351 Tinea unguium: Secondary | ICD-10-CM

## 2024-03-05 DIAGNOSIS — L03031 Cellulitis of right toe: Secondary | ICD-10-CM

## 2024-03-05 NOTE — Progress Notes (Signed)
 Patient presents today for laser nail maintenance. Diagnosed with mycotic nail infection by Dr. Tobie.    Toenail most affected are all ten, but very mild. She is mostly concerned about the 2nd nails bilateral.  She relates the nails have been improving with each visit and she is happy with the 3 month preventive treatments.     Right third toenail was reactive to the laser on the lateral corner - she had an ingrown toe nail removal procedure in June   All other systems are negative.   Nails were filed thin. Laser therapy was administered to 1-5 toenails bilateral and patient tolerated the treatment well.    All safety precautions were in place.    She will follow up in 3 months for another laser treatment.

## 2024-05-28 ENCOUNTER — Emergency Department
Admission: EM | Admit: 2024-05-28 | Discharge: 2024-05-28 | Disposition: A | Discharge status: Home / Self Care | Attending: Emergency Medicine | Admitting: Emergency Medicine

## 2024-05-28 ENCOUNTER — Emergency Department

## 2024-05-28 ENCOUNTER — Other Ambulatory Visit: Payer: Self-pay

## 2024-05-28 DIAGNOSIS — S199XXA Unspecified injury of neck, initial encounter: Secondary | ICD-10-CM | POA: Diagnosis present

## 2024-05-28 DIAGNOSIS — S39012A Strain of muscle, fascia and tendon of lower back, initial encounter: Secondary | ICD-10-CM | POA: Insufficient documentation

## 2024-05-28 DIAGNOSIS — Y92410 Unspecified street and highway as the place of occurrence of the external cause: Secondary | ICD-10-CM | POA: Diagnosis not present

## 2024-05-28 DIAGNOSIS — S161XXA Strain of muscle, fascia and tendon at neck level, initial encounter: Secondary | ICD-10-CM | POA: Diagnosis not present

## 2024-05-28 DIAGNOSIS — E039 Hypothyroidism, unspecified: Secondary | ICD-10-CM | POA: Insufficient documentation

## 2024-05-28 MED ORDER — MELOXICAM 15 MG PO TABS: 15.0000 mg | ORAL_TABLET | Freq: Every day | ORAL | 0 refills | Status: AC
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
## 2024-05-28 NOTE — ED Notes (Signed)
 Pt is in x-ray at this time, x-ray will bring pt to [facility] once finished with imaging.

## 2024-06-04 ENCOUNTER — Ambulatory Visit (INDEPENDENT_AMBULATORY_CARE_PROVIDER_SITE_OTHER): Payer: Self-pay | Admitting: Podiatrist

## 2024-06-04 DIAGNOSIS — B351 Tinea unguium: Secondary | ICD-10-CM

## 2024-06-04 NOTE — Progress Notes (Signed)
 Patient presents today for laser nail maintenance. Diagnosed with mycotic nail infection by Dr. Tobie.    Toenail most affected are all ten, but very mild. She is mostly concerned about the 2nd nails bilateral.  She relates the nails have been improving with each visit and she is happy with the 3 month preventive treatments.    Right third toenail ingrown nail has healed well-  no residual pain.    All other systems are negative.   All nails trimmed a little and smoothed with a power burr. Laser therapy was administered to 1-5 toenails bilateral and patient tolerated the treatment well.   All safety precautions were in place.    She will follow up in 3 months for another laser treatment.

## 2024-08-11 ENCOUNTER — Encounter: Payer: Self-pay | Admitting: Ophthalmology

## 2024-08-13 NOTE — Discharge Instructions (Signed)

## 2024-08-16 HISTORY — DX: Hypothyroidism, unspecified: E03.9

## 2024-08-20 ENCOUNTER — Encounter: Payer: Self-pay | Admitting: Anesthesiology

## 2024-08-20 ENCOUNTER — Encounter: Payer: Self-pay | Admitting: Ophthalmology

## 2024-08-20 ENCOUNTER — Encounter: Admission: RE | Disposition: A | Payer: Self-pay | Source: Home / Self Care | Attending: Ophthalmology

## 2024-08-20 ENCOUNTER — Ambulatory Visit
Admission: RE | Admit: 2024-08-20 | Discharge: 2024-08-20 | Disposition: A | Source: Home / Self Care | Attending: Ophthalmology | Admitting: Ophthalmology

## 2024-08-20 ENCOUNTER — Other Ambulatory Visit: Payer: Self-pay

## 2024-08-20 MED ORDER — MIDAZOLAM HCL (PF) 2 MG/2ML IJ SOLN
INTRAMUSCULAR | Status: DC | PRN
  Administered 2024-08-20 (×2): 1 mg via INTRAVENOUS

## 2024-08-20 MED ORDER — MOXIFLOXACIN HCL 0.5 % OP SOLN
OPHTHALMIC | Status: DC | PRN
  Administered 2024-08-20: .2 mL via OPHTHALMIC

## 2024-08-20 MED ORDER — LIDOCAINE HCL (PF) 2 % IJ SOLN
INTRAOCULAR | Status: DC | PRN
  Administered 2024-08-20: 4 mL via INTRAOCULAR

## 2024-08-20 MED ORDER — MIDAZOLAM HCL 2 MG/2ML IJ SOLN
INTRAMUSCULAR | Status: AC
  Filled 2024-08-20: qty 2

## 2024-08-20 MED ORDER — CYCLOPENTOLATE HCL 2 % OP SOLN
OPHTHALMIC | Status: AC
  Filled 2024-08-20: qty 2

## 2024-08-20 MED ORDER — LACTATED RINGERS IV SOLN: INTRAVENOUS | Status: DC

## 2024-08-20 MED ORDER — FENTANYL CITRATE (PF) 100 MCG/2ML IJ SOLN
INTRAMUSCULAR | Status: DC | PRN
  Administered 2024-08-20: 50 ug via INTRAVENOUS

## 2024-08-20 MED ORDER — SIGHTPATH DOSE#1 BSS IO SOLN
INTRAOCULAR | Status: DC | PRN
  Administered 2024-08-20: 77 mL via OPHTHALMIC

## 2024-08-20 MED ORDER — LABETALOL HCL 5 MG/ML IV SOLN
INTRAVENOUS | Status: AC
  Filled 2024-08-20: qty 4

## 2024-08-20 MED ORDER — TETRACAINE HCL 0.5 % OP SOLN
OPHTHALMIC | Status: AC
  Filled 2024-08-20: qty 4

## 2024-08-20 MED ORDER — SIGHTPATH DOSE#1 BSS IO SOLN
INTRAOCULAR | Status: DC | PRN
  Administered 2024-08-20: 15 mL via INTRAOCULAR

## 2024-08-20 MED ORDER — TETRACAINE HCL 0.5 % OP SOLN
1.0000 [drp] | OPHTHALMIC | Status: DC | PRN
  Administered 2024-08-20 (×3): 1 [drp] via OPHTHALMIC

## 2024-08-20 MED ORDER — PHENYLEPHRINE HCL 10 % OP SOLN
1.0000 [drp] | OPHTHALMIC | Status: AC | PRN
  Administered 2024-08-20 (×3): 1 [drp] via OPHTHALMIC

## 2024-08-20 MED ORDER — SIGHTPATH DOSE#1 NA HYALUR & NA CHOND-NA HYALUR IO KIT
PACK | INTRAOCULAR | Status: DC | PRN
  Administered 2024-08-20: 1 via OPHTHALMIC

## 2024-08-20 MED ORDER — FENTANYL CITRATE (PF) 100 MCG/2ML IJ SOLN
INTRAMUSCULAR | Status: AC
  Filled 2024-08-20: qty 2

## 2024-08-20 MED ORDER — PHENYLEPHRINE HCL 10 % OP SOLN
OPHTHALMIC | Status: AC
  Filled 2024-08-20: qty 5

## 2024-08-20 MED ORDER — CYCLOPENTOLATE HCL 2 % OP SOLN
1.0000 [drp] | OPHTHALMIC | Status: DC | PRN
  Administered 2024-08-20 (×3): 1 [drp] via OPHTHALMIC

## 2024-08-20 NOTE — Anesthesia Postprocedure Evaluation (Signed)
"   Anesthesia Post Note  Patient: Alexandria Thompson  Procedure(s) Performed: PHACOEMULSIFICATION, CATARACT, WITH IOL INSERTION 11.00 00:54.9 (Right: Eye)  Patient location during evaluation: PACU Anesthesia Type: MAC Level of consciousness: awake and alert Pain management: pain level controlled Vital Signs Assessment: post-procedure vital signs reviewed and stable Respiratory status: spontaneous breathing, nonlabored ventilation, respiratory function stable and patient connected to nasal cannula oxygen Cardiovascular status: stable and blood pressure returned to baseline Postop Assessment: no apparent nausea or vomiting Anesthetic complications: no   No notable events documented.   Last Vitals: There were no vitals filed for this visit.  Last Pain: There were no vitals filed for this visit.               [name]      "

## 2024-08-20 NOTE — Anesthesia Preprocedure Evaluation (Signed)
"                                    Anesthesia Evaluation  Patient identified by MRN, date of birth, ID band Patient awake    Reviewed: Allergy & Precautions, H&P , NPO status , Patient's Chart, lab work & pertinent test results, reviewed documented beta blocker date and time   Airway Mallampati: II  TM Distance: >3 FB Neck ROM: Full    Dental no notable dental hx.    Pulmonary neg pulmonary ROS   Pulmonary exam normal breath sounds clear to auscultation       Cardiovascular Exercise Tolerance: Good negative cardio ROS Normal cardiovascular exam Rhythm:Regular Rate:Normal     Neuro/Psych  PSYCHIATRIC DISORDERS      negative neurological ROS     GI/Hepatic negative GI ROS, Neg liver ROS,,,  Endo/Other  negative endocrine ROS    Renal/GU negative Renal ROS  negative genitourinary   Musculoskeletal negative musculoskeletal ROS (+)    Abdominal   Peds negative pediatric ROS (+)  Hematology negative hematology ROS (+)   Anesthesia Other Findings   Reproductive/Obstetrics negative OB ROS                              Anesthesia Physical Anesthesia Plan  ASA: 1  Anesthesia Plan: General and MAC   Post-op Pain Management:    Induction: Intravenous  PONV Risk Score and Plan:   Airway Management Planned:   Additional Equipment:   Intra-op Plan:   Post-operative Plan: Extubation in OR  Informed Consent: I have reviewed the patients History and Physical, chart, labs and discussed the procedure including the risks, benefits and alternatives for the proposed anesthesia with the patient or authorized representative who has indicated his/her understanding and acceptance.     Dental advisory given  Plan Discussed with: CRNA  Anesthesia Plan Comments:         Anesthesia Quick Evaluation  "

## 2024-08-20 NOTE — Op Note (Signed)
 OPERATIVE NOTE  Alexandria Thompson [id number] 08/20/2024   PREOPERATIVE DIAGNOSIS:  Nuclear sclerotic cataract right eye.  H25.11   POSTOPERATIVE DIAGNOSIS:    Nuclear sclerotic cataract right eye.     PROCEDURE:  Phacoemusification with posterior chamber intraocular lens placement of the right eye   LENS:   Implant Name Type Inv. Item Serial No. Manufacturer Lot No. LRB No. Used Action  LENS IOL TECNIS EYHANCE 18.0 - [id number] Intraocular Lens LENS IOL TECNIS EYHANCE 18.0 [id number] [employer]  Right 1 Implanted       Procedures: PHACOEMULSIFICATION, CATARACT, WITH IOL INSERTION 11.00 00:54.9 (Right)  SURGEON:  Adine Novak, MD, MPH  ANESTHESIOLOGIST: Anesthesiologist: Lanice Redell POUR, MD CRNA: Veronica Alm BROCKS, CRNA   ANESTHESIA:  Topical with tetracaine  drops augmented with 1% preservative-free intracameral lidocaine .  ESTIMATED BLOOD LOSS: less than 1 mL.   COMPLICATIONS:  None.   DESCRIPTION OF PROCEDURE:  The patient was identified in the holding room and transported to the operating room and placed in the supine position under the operating microscope.  The right eye was identified as the operative eye and it was prepped and draped in the usual sterile ophthalmic fashion.   A 1.0 millimeter clear-corneal paracentesis was made at the 10:30 position. 0.5 ml of preservative-free 1% lidocaine  with epinephrine  was injected into the anterior chamber.  The anterior chamber was filled with viscoelastic.  A 2.4 millimeter keratome was used to make a near-clear corneal incision at the 8:00 position.  A curvilinear capsulorrhexis was made with a cystotome and capsulorrhexis forceps.  Balanced salt solution was used to hydrodissect and hydrodelineate the nucleus.   Phacoemulsification was then used in stop and chop fashion to remove the lens nucleus and epinucleus.  The remaining cortex was then removed using the irrigation and aspiration handpiece. Viscoelastic was then placed into the  capsular bag to distend it for lens placement.  A lens was then injected into the capsular bag.  The remaining viscoelastic was aspirated.   Wounds were hydrated with balanced salt solution.  The anterior chamber was inflated to a physiologic pressure with balanced salt solution.   Intracameral vigamox  0.1 mL undiluted was injected into the eye and a drop placed onto the ocular surface.  No wound leaks were noted.  The patient was taken to the recovery room in stable condition without complications of anesthesia or surgery  Adine Novak 08/20/2024, 1:13 PM

## 2024-08-20 NOTE — Anesthesia Postprocedure Evaluation (Signed)
"   Anesthesia Post Note  Patient: Alexandria Thompson  Procedure(s) Performed: PHACOEMULSIFICATION, CATARACT, WITH IOL INSERTION 11.00 00:54.9 (Right: Eye)  Patient location during evaluation: PACU Anesthesia Type: MAC Level of consciousness: awake and alert Pain management: pain level controlled Vital Signs Assessment: post-procedure vital signs reviewed and stable Respiratory status: spontaneous breathing, nonlabored ventilation, respiratory function stable and patient connected to nasal cannula oxygen Cardiovascular status: blood pressure returned to baseline and stable Postop Assessment: no apparent nausea or vomiting Anesthetic complications: no   No notable events documented.   Last Vitals:  Vitals:   08/20/24 1315  BP: (!) 91/53  Pulse: 62  Resp: 11  SpO2: 95%    Last Pain: There were no vitals filed for this visit.               Alexandria Thompson      "

## 2024-08-20 NOTE — Transfer of Care (Signed)
 Immediate Anesthesia Transfer of Care Note  Patient: Alexandria Thompson  Procedure(s) Performed: PHACOEMULSIFICATION, CATARACT, WITH IOL INSERTION 11.00 00:54.9 (Right: Eye)  Patient Location: PACU  Anesthesia Type: General, MAC  Level of Consciousness: awake, alert  and patient cooperative  Airway and Oxygen Therapy: Patient Spontanous Breathing   Post-op Assessment: Post-op Vital signs reviewed, Patient's Cardiovascular Status Stable, Respiratory Function Stable, Patent Airway and No signs of Nausea or vomiting  Post-op Vital Signs: Reviewed and stable  Complications: No notable events documented.

## 2024-08-20 NOTE — H&P (Signed)
 [facility]   Primary Care Physician:  Diedra Lame, MD Ophthalmologist: Dr. Adine Novak  Pre-Procedure History & Physical: HPI:  Alexandria Thompson is a 69 y.o. female here for cataract surgery.   Past Medical History:  Diagnosis Date   Hypothyroidism     Past Surgical History:  Procedure Laterality Date   BLADDER REPAIR N/A     Prior to Admission medications  Medication Sig Start Date End Date Taking? Authorizing Provider  levothyroxine (SYNTHROID, LEVOTHROID) 88 MCG tablet Take by mouth. 03/06/15 08/20/24 Yes [provider]  Multiple Vitamin (MULTI-VITAMINS) TABS Take by mouth.   Yes [provider]  rosuvastatin (CRESTOR) 5 MG tablet Take 5 mg by mouth daily. 01/25/20  Yes [provider]  sertraline (ZOLOFT) 100 MG tablet Take by mouth. 03/06/15  Yes [provider]  ALPRAZolam (XANAX) 0.25 MG tablet Take by mouth at bedtime as needed. 05/15/17   [provider]    Allergies as of 06/28/2024 - Review Complete 05/28/2024  Allergen Reaction Noted   Codeine Hives and Nausea Only 11/29/2015    Family History  Problem Relation Age of Onset   Breast cancer Sister    Breast cancer Paternal Grandmother     Social History   Socioeconomic History   Marital status: Married    Spouse name: Not on file   Number of children: Not on file   Years of education: Not on file   Highest education level: Not on file  Occupational History   Not on file  Tobacco Use   Smoking status: Never   Smokeless tobacco: Never  Vaping Use   Vaping status: Never Used  Substance and Sexual Activity   Alcohol use: No   Drug use: Never   Sexual activity: Not on file  Other Topics Concern   Not on file  Social History Narrative   Not on file   Social Drivers of Health   Tobacco Use: Low Risk (08/20/2024)   Patient History    Smoking Tobacco Use: Never    Smokeless Tobacco Use: Never    Passive Exposure: Not on file  Financial  Resource Strain: Low Risk  (11/18/2023)   Received [facility]   Overall Financial Resource Strain (CARDIA)    Difficulty of Paying Living Expenses: Not hard at all  Food Insecurity: No Food Insecurity (11/18/2023)   Received from [facility]   Epic    Within the past 12 months, you worried that your food would run out before you got the money to buy more.: Never true    Within the past 12 months, the food you bought just didn't last and you didn't have money to get more.: Never true  Transportation Needs: No Transportation Needs (11/18/2023)   Received from [facility] - Transportation    In the past 12 months, has lack of transportation kept you from medical appointments or from getting medications?: No    Lack of Transportation (Non-Medical): No  Physical Activity: Not on file  Stress: Not on file  Social Connections: Not on file  Intimate Partner Violence: Not on file  Depression (EYV7-0): Not on file  Alcohol Screen: Not on file  Housing: Low Risk  (11/18/2023)   Received from [facility]   Epic    In the last 12 months, was there a time when you were not able to pay the mortgage or rent on time?: No  In the past 12 months, how many times have you moved where you were living?: 0    At any time in the past 12 months, were you homeless or living in a shelter (including now)?: No  Utilities: Not At Risk (11/18/2023)   Received from [facility] Utilities    Threatened with loss of utilities: No  Health Literacy: Not on file    Review of Systems: See HPI, otherwise negative ROS  Physical Exam: Ht 5' 2 (1.575 m)   Wt 59.9 kg   BMI 24.14 kg/m  General:   Alert, cooperative. Head:  Normocephalic and atraumatic. Respiratory:  Normal work of breathing. Cardiovascular:  NAD  Impression/Plan: Alexandria Thompson is here for cataract surgery.  Risks, benefits, limitations,  and alternatives regarding cataract surgery have been reviewed with the patient.  Questions have been answered.  All parties agreeable.   Adine Novak, MD  08/20/2024, 12:50 PM

## 2024-08-23 ENCOUNTER — Ambulatory Visit: Payer: MEDICARE | Admitting: Dermatology

## 2024-08-30 ENCOUNTER — Ambulatory Visit: Admission: RE | Admit: 2024-08-30 | Source: Home / Self Care | Admitting: Ophthalmology

## 2024-08-30 ENCOUNTER — Encounter: Admission: RE | Payer: Self-pay | Source: Home / Self Care

## 2024-09-03 ENCOUNTER — Ambulatory Visit
